# Patient Record
Sex: Female | Born: 1980 | Race: Asian | Hispanic: No | Marital: Married | Smoking: Never smoker
Health system: Southern US, Community
[De-identification: ages and names within clinical notes are randomized; demographics above are authoritative.]

## PROBLEM LIST (undated history)

## (undated) DIAGNOSIS — Z789 Other specified health status: Secondary | ICD-10-CM

---

## 2016-02-25 ENCOUNTER — Other Ambulatory Visit: Payer: Self-pay | Admitting: Family Medicine

## 2016-02-25 ENCOUNTER — Ambulatory Visit (INDEPENDENT_AMBULATORY_CARE_PROVIDER_SITE_OTHER): Payer: Managed Care, Other (non HMO) | Admitting: Family Medicine

## 2016-02-25 ENCOUNTER — Emergency Department (HOSPITAL_COMMUNITY): Payer: Managed Care, Other (non HMO)

## 2016-02-25 ENCOUNTER — Encounter (HOSPITAL_COMMUNITY): Payer: Self-pay | Admitting: Emergency Medicine

## 2016-02-25 ENCOUNTER — Ambulatory Visit (INDEPENDENT_AMBULATORY_CARE_PROVIDER_SITE_OTHER): Payer: Managed Care, Other (non HMO)

## 2016-02-25 ENCOUNTER — Inpatient Hospital Stay (HOSPITAL_COMMUNITY)
Admission: EM | Admit: 2016-02-25 | Discharge: 2016-02-26 | DRG: 390 | Disposition: A | Discharge status: Home / Self Care | Payer: Managed Care, Other (non HMO) | Attending: Surgery | Admitting: Surgery

## 2016-02-25 ENCOUNTER — Encounter (HOSPITAL_COMMUNITY): Admission: EM | Disposition: A | Discharge status: Home / Self Care | Payer: Self-pay | Source: Home / Self Care

## 2016-02-25 VITALS — BP 104/70 | HR 70 | Temp 97.6°F | Resp 18 | Ht 65.0 in | Wt 125.2 lb

## 2016-02-25 DIAGNOSIS — K5900 Constipation, unspecified: Secondary | ICD-10-CM | POA: Diagnosis not present

## 2016-02-25 DIAGNOSIS — K5909 Other constipation: Secondary | ICD-10-CM | POA: Diagnosis present

## 2016-02-25 DIAGNOSIS — K56609 Unspecified intestinal obstruction, unspecified as to partial versus complete obstruction: Secondary | ICD-10-CM

## 2016-02-25 DIAGNOSIS — D72829 Elevated white blood cell count, unspecified: Secondary | ICD-10-CM | POA: Diagnosis present

## 2016-02-25 DIAGNOSIS — K5660 Unspecified intestinal obstruction: Secondary | ICD-10-CM | POA: Diagnosis not present

## 2016-02-25 DIAGNOSIS — R1084 Generalized abdominal pain: Secondary | ICD-10-CM

## 2016-02-25 DIAGNOSIS — E876 Hypokalemia: Secondary | ICD-10-CM | POA: Diagnosis present

## 2016-02-25 DIAGNOSIS — K562 Volvulus: Secondary | ICD-10-CM | POA: Diagnosis present

## 2016-02-25 DIAGNOSIS — M545 Low back pain, unspecified: Secondary | ICD-10-CM

## 2016-02-25 HISTORY — PX: FLEXIBLE SIGMOIDOSCOPY: SHX5431

## 2016-02-25 HISTORY — DX: Other specified health status: Z78.9

## 2016-02-25 LAB — POCT CBC
Granulocyte percent: 76.4 %G (ref 37–80)
HCT, POC: 36 % — AB (ref 37.7–47.9)
Hemoglobin: 12.8 g/dL (ref 12.2–16.2)
Lymph, poc: 1.8 (ref 0.6–3.4)
MCH, POC: 29.3 pg (ref 27–31.2)
MCHC: 35.6 g/dL — AB (ref 31.8–35.4)
MCV: 82.4 fL (ref 80–97)
MID (CBC): 0.8 (ref 0–0.9)
MPV: 7.9 fL (ref 0–99.8)
PLATELET COUNT, POC: 277 10*3/uL (ref 142–424)
POC Granulocyte: 8.4 — AB (ref 2–6.9)
POC LYMPH %: 16.3 % (ref 10–50)
POC MID %: 7.3 %M (ref 0–12)
RBC: 4.37 M/uL (ref 4.04–5.48)
RDW, POC: 16.3 %
WBC: 11 10*3/uL — AB (ref 4.6–10.2)

## 2016-02-25 LAB — COMPREHENSIVE METABOLIC PANEL
ALBUMIN: 4.9 g/dL (ref 3.6–5.1)
ALBUMIN: 4.9 g/dL (ref 3.5–5.0)
ALK PHOS: 67 U/L (ref 33–115)
ALK PHOS: 62 U/L (ref 38–126)
ALT: 9 U/L (ref 6–29)
ALT: 13 U/L — ABNORMAL LOW (ref 14–54)
ANION GAP: 10 (ref 5–15)
AST: 14 U/L (ref 10–30)
AST: 17 U/L (ref 15–41)
BILIRUBIN TOTAL: 0.8 mg/dL (ref 0.2–1.2)
BILIRUBIN TOTAL: 0.8 mg/dL (ref 0.3–1.2)
BUN: 6 mg/dL — ABNORMAL LOW (ref 7–25)
BUN: 6 mg/dL (ref 6–20)
CALCIUM: 9.9 mg/dL (ref 8.6–10.2)
CALCIUM: 9.3 mg/dL (ref 8.9–10.3)
CO2: 29 mmol/L (ref 20–31)
CO2: 26 mmol/L (ref 22–32)
CREATININE: 0.62 mg/dL (ref 0.50–1.10)
Chloride: 98 mmol/L (ref 98–110)
Chloride: 99 mmol/L — ABNORMAL LOW (ref 101–111)
Creatinine, Ser: 0.62 mg/dL (ref 0.44–1.00)
GLUCOSE: 117 mg/dL — AB (ref 65–99)
GLUCOSE: 102 mg/dL — AB (ref 65–99)
POTASSIUM: 3.3 mmol/L — AB (ref 3.5–5.1)
Potassium: 3.7 mmol/L (ref 3.5–5.3)
Sodium: 137 mmol/L (ref 135–146)
Sodium: 135 mmol/L (ref 135–145)
TOTAL PROTEIN: 7.8 g/dL (ref 6.5–8.1)
Total Protein: 7.7 g/dL (ref 6.1–8.1)

## 2016-02-25 LAB — POCT URINALYSIS DIPSTICK
Glucose, UA: NEGATIVE
Ketones, UA: 15
Nitrite, UA: NEGATIVE
PH UA: 5.5
PROTEIN UA: 30
SPEC GRAV UA: 1.025
UROBILINOGEN UA: 0.2

## 2016-02-25 LAB — POCT URINE PREGNANCY: PREG TEST UR: NEGATIVE

## 2016-02-25 LAB — CBC WITH DIFFERENTIAL/PLATELET
BASOS ABS: 0 {cells}/uL (ref 0–200)
Basophils Relative: 0 %
EOS PCT: 0 %
Eosinophils Absolute: 0 cells/uL — ABNORMAL LOW (ref 15–500)
HCT: 37.6 % (ref 35.0–45.0)
Hemoglobin: 12.7 g/dL (ref 11.7–15.5)
LYMPHS PCT: 15 %
Lymphs Abs: 1620 cells/uL (ref 850–3900)
MCH: 28.3 pg (ref 27.0–33.0)
MCHC: 33.8 g/dL (ref 32.0–36.0)
MCV: 83.7 fL (ref 80.0–100.0)
MONOS PCT: 7 %
MPV: 10.9 fL (ref 7.5–12.5)
Monocytes Absolute: 756 cells/uL (ref 200–950)
NEUTROS ABS: 8424 {cells}/uL — AB (ref 1500–7800)
Neutrophils Relative %: 78 %
PLATELETS: 332 10*3/uL (ref 140–400)
RBC: 4.49 MIL/uL (ref 3.80–5.10)
RDW: 15.8 % — AB (ref 11.0–15.0)
WBC: 10.8 10*3/uL (ref 3.8–10.8)

## 2016-02-25 SURGERY — SIGMOIDOSCOPY, FLEXIBLE
Anesthesia: Moderate Sedation

## 2016-02-25 MED ORDER — DIPHENHYDRAMINE HCL 50 MG/ML IJ SOLN
INTRAMUSCULAR | Status: AC
  Filled 2016-02-25: qty 1

## 2016-02-25 MED ORDER — IOPAMIDOL (ISOVUE-300) INJECTION 61%: 100.0000 mL | Freq: Once | INTRAVENOUS | Status: DC | PRN

## 2016-02-25 MED ORDER — SODIUM CHLORIDE 0.9 % IV BOLUS (SEPSIS)
1000.0000 mL | Freq: Once | INTRAVENOUS | Status: AC
  Administered 2016-02-25: 1000 mL via INTRAVENOUS

## 2016-02-25 MED ORDER — ONDANSETRON HCL 4 MG/2ML IJ SOLN
4.0000 mg | Freq: Once | INTRAMUSCULAR | Status: AC
  Administered 2016-02-25: 4 mg via INTRAVENOUS
  Filled 2016-02-25: qty 2

## 2016-02-25 MED ORDER — POTASSIUM CHLORIDE 10 MEQ/100ML IV SOLN: 10.0000 meq | INTRAVENOUS | Status: AC

## 2016-02-25 MED ORDER — ONDANSETRON 4 MG PO TBDP
4.0000 mg | ORAL_TABLET | Freq: Four times a day (QID) | ORAL | Status: DC | PRN
Start: 2016-02-25 — End: 2016-02-26

## 2016-02-25 MED ORDER — KCL IN DEXTROSE-NACL 20-5-0.9 MEQ/L-%-% IV SOLN
INTRAVENOUS | Status: DC
  Administered 2016-02-25 – 2016-02-26 (×2): via INTRAVENOUS
  Filled 2016-02-25 (×4): qty 1000

## 2016-02-25 MED ORDER — ONDANSETRON HCL 4 MG/2ML IJ SOLN: 4.0000 mg | INTRAMUSCULAR | Status: DC | PRN

## 2016-02-25 MED ORDER — FENTANYL CITRATE (PF) 100 MCG/2ML IJ SOLN
INTRAMUSCULAR | Status: DC | PRN
  Administered 2016-02-25: 12.5 ug via INTRAVENOUS

## 2016-02-25 MED ORDER — FENTANYL CITRATE (PF) 100 MCG/2ML IJ SOLN
INTRAMUSCULAR | Status: AC
  Filled 2016-02-25: qty 2

## 2016-02-25 MED ORDER — FAMOTIDINE IN NACL 20-0.9 MG/50ML-% IV SOLN
20.0000 mg | Freq: Two times a day (BID) | INTRAVENOUS | Status: DC
  Administered 2016-02-25 – 2016-02-26 (×2): 20 mg via INTRAVENOUS
  Filled 2016-02-25 (×2): qty 50

## 2016-02-25 MED ORDER — MORPHINE SULFATE (PF) 4 MG/ML IV SOLN
4.0000 mg | Freq: Once | INTRAVENOUS | Status: AC
  Administered 2016-02-25: 4 mg via INTRAVENOUS
  Filled 2016-02-25: qty 1

## 2016-02-25 MED ORDER — MORPHINE SULFATE (PF) 4 MG/ML IV SOLN
4.0000 mg | INTRAVENOUS | Status: AC | PRN
  Administered 2016-02-25 (×3): 4 mg via INTRAVENOUS
  Filled 2016-02-25 (×3): qty 1

## 2016-02-25 MED ORDER — MIDAZOLAM HCL 10 MG/2ML IJ SOLN
INTRAMUSCULAR | Status: DC | PRN
  Administered 2016-02-25 (×2): 1 mg via INTRAVENOUS

## 2016-02-25 MED ORDER — ACETAMINOPHEN 10 MG/ML IV SOLN
1000.0000 mg | Freq: Four times a day (QID) | INTRAVENOUS | Status: DC | PRN
  Filled 2016-02-25: qty 100

## 2016-02-25 MED ORDER — DIATRIZOATE MEGLUMINE & SODIUM 66-10 % PO SOLN
15.0000 mL | Freq: Once | ORAL | Status: AC
  Administered 2016-02-25: 15 mL via ORAL

## 2016-02-25 MED ORDER — POTASSIUM CHLORIDE 10 MEQ/100ML IV SOLN
10.0000 meq | INTRAVENOUS | Status: AC
  Administered 2016-02-25 – 2016-02-26 (×2): 10 meq via INTRAVENOUS
  Filled 2016-02-25 (×2): qty 100

## 2016-02-25 MED ORDER — MIDAZOLAM HCL 5 MG/ML IJ SOLN
INTRAMUSCULAR | Status: AC
  Filled 2016-02-25: qty 2

## 2016-02-25 MED ORDER — SODIUM CHLORIDE 0.9 % IV SOLN
Freq: Once | INTRAVENOUS | Status: AC
  Administered 2016-02-25: 17:00:00 via INTRAVENOUS

## 2016-02-25 NOTE — ED Notes (Signed)
Pt being sent for large bowel obstruction vs ileus.  Pt c/o gen abd pain.  Has been vomiting.  Pain x 2-3 days.

## 2016-02-25 NOTE — ED Provider Notes (Signed)
Dr. Evette CristalGanem will eval the patient.  She will be admitted to hospitalist service.  Sylvia Munchobert Nilani Hugill, MD 02/25/16 [phone]

## 2016-02-25 NOTE — ED Notes (Signed)
Jeraldine LootsLockwood EDP gave verbal order to administer another morphine 4mg  IVP after 1723 dose.  Wants to hold NG tube insertion, [place] [facility] on his way to consult.

## 2016-02-25 NOTE — ED Notes (Signed)
Family at bedside. 

## 2016-02-25 NOTE — H&P (View-Only) (Signed)
Reason for Consult:Sigmoid Volvulus Referring Physician: Dr. Lockwood  Sylvia Barron is an 35 y.o. female.  HPI: She has a 4 day history of progressive abdominal bloating, pain and inability to have a BM.  She does have a history of chronic constipation and was straining to have a BM but was unable to pass any stool.  She has been getting progressing more distended since that time and has had some non-bloody n/v.  She states she is having pain in the left lower back.  No fever or chills.  Her husband is with her.  Past Medical History  Diagnosis Date  . Medical history non-contributory     Past Surgical History  Procedure Laterality Date  . Cesarean section      History reviewed. No pertinent family history.  Social History:  reports that she has never smoked. She has never used smokeless tobacco. She reports that she does not drink alcohol or use illicit drugs.  Allergies: No Known Allergies  Prior to Admission medications   Medication Sig Start Date End Date Taking? Authorizing Provider  polyethylene glycol (MIRALAX / GLYCOLAX) packet Take 17 g by mouth every 3 (three) hours as needed for mild constipation.   Yes Historical Provider, MD     Results for orders placed or performed during the hospital encounter of 02/25/16 (from the past 48 hour(s))  Comprehensive metabolic panel     Status: Abnormal   Collection Time: 02/25/16  1:59 PM  Result Value Ref Range   Sodium 135 135 - 145 mmol/L   Potassium 3.3 (L) 3.5 - 5.1 mmol/L   Chloride 99 (L) 101 - 111 mmol/L   CO2 26 22 - 32 mmol/L   Glucose, Bld 102 (H) 65 - 99 mg/dL   BUN 6 6 - 20 mg/dL   Creatinine, Ser 0.62 0.44 - 1.00 mg/dL   Calcium 9.3 8.9 - 10.3 mg/dL   Total Protein 7.8 6.5 - 8.1 g/dL   Albumin 4.9 3.5 - 5.0 g/dL   AST 17 15 - 41 U/L   ALT 13 (L) 14 - 54 U/L   Alkaline Phosphatase 62 38 - 126 U/L   Total Bilirubin 0.8 0.3 - 1.2 mg/dL   GFR calc non Af Amer >60 >60 mL/min   GFR calc Af Amer >60 >60 mL/min     Comment: (NOTE) The eGFR has been calculated using the CKD EPI equation. This calculation has not been validated in all clinical situations. eGFR's persistently <60 mL/min signify possible Chronic Kidney Disease.    Anion gap 10 5 - 15    Ct Abdomen Pelvis W Contrast  02/25/2016  CLINICAL DATA:  Abdominal pain with nausea for 2 days, no bowel movement for 4 days, no flatus, abdominal distension but soft, question bowel obstruction EXAM: CT ABDOMEN AND PELVIS WITH CONTRAST TECHNIQUE: Multidetector CT imaging of the abdomen and pelvis was performed using the standard protocol following bolus administration of intravenous contrast. Sagittal and coronal MPR images reconstructed from axial data set. CONTRAST:  Dilute oral contrast.  100 cc Isovue 300 IV. COMPARISON:  None. FINDINGS: Lung bases clear. Nonspecific low-attenuation lesion posterior RIGHT lobe liver 10 x 9 mm diameter image 28. Liver, gallbladder, spleen, pancreas, kidneys, and adrenal glands normal. Marked distention of the colon particularly a large redundant dilated sigmoid loop extending into the LEFT upper quadrant bulb of the LEFT diaphragm measuring up to 15.2 cm transverse. Twisting of the mesentery identified at the sigmoid mesocolon, most prominently identified on coronal imaging, compatible   with sigmoid volvulus. No colonic wall thickening or evidence of perforation. Small bowel decompressed. Stomach unremarkable. Normal appearing ureters, bladder, uterus, adnexae, and appendix. No mass, adenopathy, free fluid, free air, hernia, or acute bone lesion. IMPRESSION: Marked colonic distention up to 15.2 cm transverse at a markedly distended sigmoid loop which extends into the LEFT upper quadrant under LEFT hemi diaphragm associated with swirling of the sigmoid mesocolon. 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 No evidence of perforation or small bowel dilatation. Findings called to Dr. Oleta Mouse On 02/25/2016  at 1651 hours. Electronically Signed   By: Lavonia Dana M.D.   On: 02/25/2016 16:51   Dg Abd 2 Views  02/25/2016  CLINICAL DATA:  Vomiting and constipation. EXAM: ABDOMEN - 2 VIEW COMPARISON:  None. FINDINGS: There is marked abnormal distension of the large bowel loops. Colonic fluid levels are identified. There is stool identified within the distal colon and in the rectum. No free intraperitoneal air identified. IMPRESSION: 1. Marked distension of the large bowel loops which may reflect distal bowel obstruction versus colonic ileus. In the setting of the distal bowel obstruction this may reflect stool impaction versus mechanical obstruction secondary to mass or stricture. Electronically Signed   By: Kerby Moors M.D.   On: 02/25/2016 11:30   Dg Abd Acute W/chest  02/25/2016  CLINICAL DATA:  The patient is being sent for evaluation of bowel obstruction versus ileus. EXAM: DG ABDOMEN ACUTE W/ 1V CHEST COMPARISON:  None. FINDINGS: There is elevation left hemidiaphragm. The heart, hila, mediastinum, lungs, and pleura are normal. Dilated loops of large and small bowel are identified with air-fluid levels on the upright view. The top of the left hemidiaphragm was not included on the upright view. No free air seen under the right hemidiaphragm. No other evidence of free air identified. The bones and soft tissues are unremarkable. IMPRESSION: 1. Evaluation for free air is limited as the left hemidiaphragm was not completely included on the upright view. However, within this limitation, no evidence of free air is identified. 2. Dilated large and small bowel with air-fluid levels on the upright view. This could be seen with ileus or colonic obstruction. The findings are concerning for a colonic obstruction. Recommend CT imaging for further evaluation. Electronically Signed   By: Dorise Bullion III M.D   On: 02/25/2016 13:40    Review of Systems  Constitutional: Negative for fever and chills.  Respiratory: Negative  for shortness of breath.   Cardiovascular: Negative for chest pain.  Gastrointestinal: Positive for nausea, vomiting, abdominal pain and constipation.  Musculoskeletal: Positive for back pain.   Blood pressure 121/83, pulse 62, temperature 98.2 F (36.8 C), temperature source Oral, resp. rate 16, last menstrual period 02/12/2016, SpO2 98 %. Physical Exam  Constitutional:  Uncomfortable appearing female  HENT:  Head: Normocephalic and atraumatic.  Cardiovascular: Normal rate and regular rhythm.   Respiratory: Effort normal and breath sounds normal.  GI: There is no tenderness. There is no guarding.  Firm and distended with hypoactive bowel sounds.  Umbilical bulge present.  Musculoskeletal: She exhibits no edema.  Neurological: She is alert.    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 Hypokalemia.  Plan:  GI consult for sigmoidoscopy and detorsion/decompression (discussed with Dr. Paulita Fujita) followed by admission and sigmoid colectomy after bowel prep.  Correct hypokalemia.  Nili Honda J 02/25/2016, 6:03 PM

## 2016-02-25 NOTE — Progress Notes (Addendum)
[facility] ED CM noted pt with coverage but no pcp listed Confirms they have recently moved here and is does not have a pcp yet  [facility] ED CM spoke with pt on how to obtain an in network pcp with insurance coverage via the customer service number or web site  Cm reviewed ED level of care for crisis/emergent services and community pcp level of care to manage continuous or chronic medical concerns.  The pt voiced understanding CM encouraged pt and discussed pt's responsibility to verify with pt's insurance carrier that any recommended medical provider offered by any emergency room or a hospital provider is within the carrier's network. The pt voiced understanding  Provided a 10 page list of intenal medicine and gastroenterology  providers  Pt c/o abdominal pain and inquired about results of pt CT scan Cm referred pt to ED RN and EDP PA/NP  CM informed ED RN that pt inquired about pain medications

## 2016-02-25 NOTE — Consult Note (Signed)
Reason for Consult:Sigmoid Volvulus Referring Physician: Dr. Georges Lynch Sylvia Barron is an 35 y.o. female.  HPI: She has a 4 day history of progressive abdominal bloating, pain and inability to have a BM.  She does have a history of chronic constipation and was straining to have a BM but was unable to pass any stool.  She has been getting progressing more distended since that time and has had some non-bloody n/v.  She states she is having pain in the left lower back.  No fever or chills.  Her husband is with her.  Past Medical History  Diagnosis Date  . Medical history non-contributory     Past Surgical History  Procedure Laterality Date  . Cesarean section      History reviewed. No pertinent family history.  Social History:  reports that she has never smoked. She has never used smokeless tobacco. She reports that she does not drink alcohol or use illicit drugs.  Allergies: No Known Allergies  Prior to Admission medications   Medication Sig Start Date End Date Taking? Authorizing Provider  polyethylene glycol (MIRALAX / GLYCOLAX) packet Take 17 g by mouth every 3 (three) hours as needed for mild constipation.   Yes Historical Provider, MD     Results for orders placed or performed during the hospital encounter of 02/25/16 (from the past 48 hour(s))  Comprehensive metabolic panel     Status: Abnormal   Collection Time: 02/25/16  1:59 PM  Result Value Ref Range   Sodium 135 135 - 145 mmol/L   Potassium 3.3 (L) 3.5 - 5.1 mmol/L   Chloride 99 (L) 101 - 111 mmol/L   CO2 26 22 - 32 mmol/L   Glucose, Bld 102 (H) 65 - 99 mg/dL   BUN 6 6 - 20 mg/dL   Creatinine, Ser 0.62 0.44 - 1.00 mg/dL   Calcium 9.3 8.9 - 10.3 mg/dL   Total Protein 7.8 6.5 - 8.1 g/dL   Albumin 4.9 3.5 - 5.0 g/dL   AST 17 15 - 41 U/L   ALT 13 (L) 14 - 54 U/L   Alkaline Phosphatase 62 38 - 126 U/L   Total Bilirubin 0.8 0.3 - 1.2 mg/dL   GFR calc non Af Amer >60 >60 mL/min   GFR calc Af Amer >60 >60 mL/min     Comment: (NOTE) The eGFR has been calculated using the CKD EPI equation. This calculation has not been validated in all clinical situations. eGFR's persistently <60 mL/min signify possible Chronic Kidney Disease.    Anion gap 10 5 - 15    Ct Abdomen Pelvis W Contrast  02/25/2016  CLINICAL DATA:  Abdominal pain with nausea for 2 days, no bowel movement for 4 days, no flatus, abdominal distension but soft, question bowel obstruction EXAM: CT ABDOMEN AND PELVIS WITH CONTRAST TECHNIQUE: Multidetector CT imaging of the abdomen and pelvis was performed using the standard protocol following bolus administration of intravenous contrast. Sagittal and coronal MPR images reconstructed from axial data set. CONTRAST:  Dilute oral contrast.  100 cc Isovue 300 IV. COMPARISON:  None. FINDINGS: Lung bases clear. Nonspecific low-attenuation lesion posterior RIGHT lobe liver 10 x 9 mm diameter image 28. Liver, gallbladder, spleen, pancreas, kidneys, and adrenal glands normal. Marked distention of the colon particularly a large redundant dilated sigmoid loop extending into the LEFT upper quadrant bulb of the LEFT diaphragm measuring up to 15.2 cm transverse. Twisting of the mesentery identified at the sigmoid mesocolon, most prominently identified on coronal imaging, compatible  with sigmoid volvulus. No colonic wall thickening or evidence of perforation. Small bowel decompressed. Stomach unremarkable. Normal appearing ureters, bladder, uterus, adnexae, and appendix. No mass, adenopathy, free fluid, free air, hernia, or acute bone lesion. IMPRESSION: Marked colonic distention up to 15.2 cm transverse at a markedly distended sigmoid loop which extends into the LEFT upper quadrant under LEFT hemi diaphragm associated with swirling of the sigmoid mesocolon. 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 No evidence of perforation or small bowel dilatation. Findings called to Dr. Oleta Mouse On 02/25/2016  at 1651 hours. Electronically Signed   By: Lavonia Dana M.D.   On: 02/25/2016 16:51   Dg Abd 2 Views  02/25/2016  CLINICAL DATA:  Vomiting and constipation. EXAM: ABDOMEN - 2 VIEW COMPARISON:  None. FINDINGS: There is marked abnormal distension of the large bowel loops. Colonic fluid levels are identified. There is stool identified within the distal colon and in the rectum. No free intraperitoneal air identified. IMPRESSION: 1. Marked distension of the large bowel loops which may reflect distal bowel obstruction versus colonic ileus. In the setting of the distal bowel obstruction this may reflect stool impaction versus mechanical obstruction secondary to mass or stricture. Electronically Signed   By: Kerby Moors M.D.   On: 02/25/2016 11:30   Dg Abd Acute W/chest  02/25/2016  CLINICAL DATA:  The patient is being sent for evaluation of bowel obstruction versus ileus. EXAM: DG ABDOMEN ACUTE W/ 1V CHEST COMPARISON:  None. FINDINGS: There is elevation left hemidiaphragm. The heart, hila, mediastinum, lungs, and pleura are normal. Dilated loops of large and small bowel are identified with air-fluid levels on the upright view. The top of the left hemidiaphragm was not included on the upright view. No free air seen under the right hemidiaphragm. No other evidence of free air identified. The bones and soft tissues are unremarkable. IMPRESSION: 1. Evaluation for free air is limited as the left hemidiaphragm was not completely included on the upright view. However, within this limitation, no evidence of free air is identified. 2. Dilated large and small bowel with air-fluid levels on the upright view. This could be seen with ileus or colonic obstruction. The findings are concerning for a colonic obstruction. Recommend CT imaging for further evaluation. Electronically Signed   By: Dorise Bullion III M.D   On: 02/25/2016 13:40    Review of Systems  Constitutional: Negative for fever and chills.  Respiratory: Negative  for shortness of breath.   Cardiovascular: Negative for chest pain.  Gastrointestinal: Positive for nausea, vomiting, abdominal pain and constipation.  Musculoskeletal: Positive for back pain.   Blood pressure 121/83, pulse 62, temperature 98.2 F (36.8 C), temperature source Oral, resp. rate 16, last menstrual period 02/12/2016, SpO2 98 %. Physical Exam  Constitutional:  Uncomfortable appearing female  HENT:  Head: Normocephalic and atraumatic.  Cardiovascular: Normal rate and regular rhythm.   Respiratory: Effort normal and breath sounds normal.  GI: There is no tenderness. There is no guarding.  Firm and distended with hypoactive bowel sounds.  Umbilical bulge present.  Musculoskeletal: She exhibits no edema.  Neurological: She is alert.    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 Hypokalemia.  Plan:  GI consult for sigmoidoscopy and detorsion/decompression (discussed with Dr. Paulita Fujita) followed by admission and sigmoid colectomy after bowel prep.  Correct hypokalemia.  Sweet Jarvis J 02/25/2016, 6:03 PM

## 2016-02-25 NOTE — ED Provider Notes (Signed)
CSN: [id number]     Arrival date & time 02/25/16  1159 History   First MD Initiated Contact with Patient 02/25/16 1255     Chief Complaint  Patient presents with  . Bowel Obstruction vs Ileus      (Consider location/radiation/quality/duration/timing/severity/associated sxs/prior Treatment) HPI 35 year old female who presents with abdominal pain nausea and vomiting. She has a history of cesarean section. States that 4 days ago she began to have intermittent abdominal pain, abdominal bloating and constipation. States that she strained very hard and has a sensation of an urge to have a bowel movement but is unable to get anything out. This had 3 episodes of nonbilious non-bloody emesis today. This has not happened to her before in the past. She has not had fevers or chills. She denies dysuria, urinary frequency vaginal bleeding or discharge. Was seen in the internal medicine clinic earlier today and had x-ray concerning for bowel obstruction and sent to the ED. Past Medical History  Diagnosis Date  . Medical history non-contributory    Past Surgical History  Procedure Laterality Date  . Cesarean section     History reviewed. No pertinent family history. Social History  Substance Use Topics  . Smoking status: Never Smoker   . Smokeless tobacco: Never Used  . Alcohol Use: No   OB History    No data available     Review of Systems 10/14 systems reviewed and are negative other than those stated in the HPI   Allergies  Review of patient's allergies indicates no known allergies.  Home Medications   Prior to Admission medications   Medication Sig Start Date End Date Taking? Authorizing Provider  polyethylene glycol (MIRALAX / GLYCOLAX) packet Take 17 g by mouth every 3 (three) hours as needed for mild constipation.   Yes Historical Provider, MD   BP 95/60 mmHg  Pulse 58  Temp(Src) 98.4 F (36.9 C) (Oral)  Resp 16  Ht 5\' 5"  (1.651 m)  Wt 147 lb 0.8 oz (66.7 kg)  BMI 24.47 kg/m2   SpO2 100%  LMP 02/12/2016 Physical Exam Physical Exam  Nursing note and vitals reviewed. Constitutional: Well developed, well nourished, non-toxic, and in no acute distress Head: Normocephalic and atraumatic.  Mouth/Throat: Oropharynx is clear and moist.  Neck: Normal range of motion. Neck supple.  Cardiovascular: Normal rate and regular rhythm.   Pulmonary/Chest: Effort normal and breath sounds normal.  Abdominal: Soft. Moderate distension There is minimal diffuse tenderness. There is no rebound and no guarding. no fecal impaction Musculoskeletal: Normal range of motion.  Neurological: Alert, no facial droop, fluent speech, moves all extremities symmetrically Skin: Skin is warm and dry.  Psychiatric: Cooperative  ED Course  Procedures (including critical care time) Labs Review Labs Reviewed  COMPREHENSIVE METABOLIC PANEL - Abnormal; Notable for the following:    Potassium 3.3 (*)    Chloride 99 (*)    Glucose, Bld 102 (*)    ALT 13 (*)    All other components within normal limits  BASIC METABOLIC PANEL - Abnormal; Notable for the following:    Glucose, Bld 124 (*)    Calcium 8.5 (*)    All other components within normal limits  CBC - Abnormal; Notable for the following:    WBC 11.4 (*)    Hemoglobin 11.6 (*)    HCT 35.3 (*)    RDW 15.6 (*)    All other components within normal limits    Imaging Review Ct Abdomen Pelvis W Contrast  02/25/2016  CLINICAL DATA:  Abdominal pain with nausea for 2 days, no bowel movement for 4 days, no flatus, abdominal distension but soft, question bowel obstruction EXAM: CT ABDOMEN AND PELVIS WITH CONTRAST TECHNIQUE: Multidetector CT imaging of the abdomen and pelvis was performed using the standard protocol following bolus administration of intravenous contrast. Sagittal and coronal MPR images reconstructed from axial data set. CONTRAST:  Dilute oral contrast.  100 cc Isovue 300 IV. COMPARISON:  None. FINDINGS: Lung bases clear. Nonspecific  low-attenuation lesion posterior RIGHT lobe liver 10 x 9 mm diameter image 28. Liver, gallbladder, spleen, pancreas, kidneys, and adrenal glands normal. Marked distention of the colon particularly a large redundant dilated sigmoid loop extending into the LEFT upper quadrant bulb of the LEFT diaphragm measuring up to 15.2 cm transverse. Twisting of the mesentery identified at the sigmoid mesocolon, most prominently identified on coronal imaging, compatible with sigmoid volvulus. No colonic wall thickening or evidence of perforation. Small bowel decompressed. Stomach unremarkable. Normal appearing ureters, bladder, uterus, adnexae, and appendix. No mass, adenopathy, free fluid, free air, hernia, or acute bone lesion. IMPRESSION: Marked colonic distention up to 15.2 cm transverse at a markedly distended sigmoid loop which extends into the LEFT upper quadrant under LEFT hemi diaphragm associated with swirling of the sigmoid mesocolon. 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 No evidence of perforation or small bowel dilatation. Findings called to Dr. Verdie Mosher On 02/25/2016 at 1651 hours. Electronically Signed   By: Ulyses Southward M.D.   On: 02/25/2016 16:51   Dg Abd 2 Views  02/25/2016  CLINICAL DATA:  Vomiting and constipation. EXAM: ABDOMEN - 2 VIEW COMPARISON:  None. FINDINGS: There is marked abnormal distension of the large bowel loops. Colonic fluid levels are identified. There is stool identified within the distal colon and in the rectum. No free intraperitoneal air identified. IMPRESSION: 1. Marked distension of the large bowel loops which may reflect distal bowel obstruction versus colonic ileus. In the setting of the distal bowel obstruction this may reflect stool impaction versus mechanical obstruction secondary to mass or stricture. Electronically Signed   By: Signa Kell M.D.   On: 02/25/2016 11:30   Dg Abd Acute W/chest  02/25/2016  CLINICAL DATA:  The patient is  being sent for evaluation of bowel obstruction versus ileus. EXAM: DG ABDOMEN ACUTE W/ 1V CHEST COMPARISON:  None. FINDINGS: There is elevation left hemidiaphragm. The heart, hila, mediastinum, lungs, and pleura are normal. Dilated loops of large and small bowel are identified with air-fluid levels on the upright view. The top of the left hemidiaphragm was not included on the upright view. No free air seen under the right hemidiaphragm. No other evidence of free air identified. The bones and soft tissues are unremarkable. IMPRESSION: 1. Evaluation for free air is limited as the left hemidiaphragm was not completely included on the upright view. However, within this limitation, no evidence of free air is identified. 2. Dilated large and small bowel with air-fluid levels on the upright view. This could be seen with ileus or colonic obstruction. The findings are concerning for a colonic obstruction. Recommend CT imaging for further evaluation. Electronically Signed   By: Gerome Sam III M.D   On: 02/25/2016 13:40   I have personally reviewed and evaluated these images and lab results as part of my medical decision-making.   EKG Interpretation None      MDM   Final diagnoses:  Sigmoid volvulus ([facility])    35 year old female with history of cesarean  section who presents with nausea vomiting abdominal pain and constipation. X-ray from internal medicine clinic visualized she has dilated loops of large and small bowel with air-fluid levels concerning for obstruction. She did have initial blood work and urine performed at clinic. UA is unremarkable and she is not pregnant. Blood counts without significant leukocytosis or anemia. We'll obtain chemistry. She will undergo CT ABd/pelvis.    CT visualized and reviewed with radiology. There is sigmoid volvulus. GI consulted for flex sigmoidoscopy for detorsion and plan for admission.    Lavera Guise, MD 02/26/16 [phone]

## 2016-02-25 NOTE — Op Note (Signed)
[facility] Patient Name: Sylvia Barron Procedure Date: 02/25/2016 MRN: [id number] Attending MD: Willis Modena , MD Date of Birth: [birth year] CSN: [id number] Age: 35 Admit Type: Outpatient Procedure:                Flexible Sigmoidoscopy Indications:              Abnormal CT of the GI tract, abdominal distention,                            Suspected volvulus, For therapy of volvulus,                            Generalized abdominal pain Providers:                Willis Modena, MD, Tomma Rakers, RN, Kandice Robinsons, Technician Referring MD:             Avel Peace, MD Medicines:                Fentanyl 12.5 micrograms IV, Midazolam 2 mg IV Complications:            No immediate complications. Estimated Blood Loss:     Estimated blood loss was minimal. Procedure:                Pre-Anesthesia Assessment:                           - Prior to the procedure, a History and Physical                            was performed, and patient medications and                            allergies were reviewed. The patient's tolerance of                            previous anesthesia was also reviewed. The risks                            and benefits of the procedure and the sedation                            options and risks were discussed with the patient.                            All questions were answered, and informed consent                            was obtained. Prior Anticoagulants: The patient has                            taken no previous anticoagulant or antiplatelet  agents. ASA Grade Assessment: I - A normal, healthy                            patient. After reviewing the risks and benefits,                            the patient was deemed in satisfactory condition to                            undergo the procedure.                           After obtaining informed consent, the scope was                   passed under direct vision. The [id number] ([id number])                            scope was introduced through the anus and advanced                            to the the descending colon. The flexible                            sigmoidoscopy was accomplished without difficulty.                            The patient tolerated the procedure well. The                            quality of the bowel preparation was unsatisfactory. Scope In: Scope Out: Findings:      The perianal and digital rectal examinations were normal.      A suspected volvulus, with edematous but otherwise viable-appearing       mucosa, was found in the distal sigmoid colon. Extensive colonic       distention upstream from the "twist" of the volvulus was noted, with       lots of air, as well as solid and liquid stool. Decompression of the       volvulus was attempted endoscopically, and partial decompression was       achieved. One liter of liquid stool suctioned, as well as large amounts       of air. Retroflexion into rectum was not performed. Abdominal exam       post-procedure showed much less abdominal distention. Estimated blood       loss was minimal. Impression:               - Preparation of the colon was unsatisfactory for                            any type of detailed exam, though mucosa at region                            of the volvulus, though edematous, appeared viable.                           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 Partial decompression                            achieved. Abdominal distention much improved after                            this procedure. Moderate Sedation:      Moderate (conscious) sedation was administered by the endoscopy nurse       and supervised by the endoscopist. The following parameters were       monitored: oxygen saturation, heart rate, blood pressure, and response       to care. Recommendation:           - Return patient to hospital ward for  ongoing care.                           - NPO today.                           - The findings and recommendations were discussed                            with the surgeon.                           - Both surgical team and I feel sigmoid resection,                            during this admission and after effective bowel                            prep, is the preferred route of management.                           Deboraha Sprang- [facility] will follow at a distance; please call                            with questions; thank you for the consultation. Procedure Code(s):        --- Professional ---                           [phone], Sigmoidoscopy, flexible; with decompression                            (for pathologic distention) (eg, volvulus,                            megacolon), including placement of decompression                            tube, when performed Diagnosis Code(s):        --- Professional ---                           K56.2, Volvulus  R10.84, Generalized abdominal pain                           R93.3, Abnormal findings on diagnostic imaging of                            other parts of digestive tract CPT copyright 2016 American [employer] Association. All rights reserved. The codes documented in this report are preliminary and upon coder review may  be revised to meet current compliance requirements. Willis Modena, MD 02/25/2016 8:29:28 PM This report has been signed electronically. Number of Addenda: 0

## 2016-02-25 NOTE — ED Notes (Signed)
Pt sent by Dr. Guinevere ScarletBlake Williams from clinic for colonic obstruction. Vital signs currently stable.

## 2016-02-25 NOTE — ED Notes (Addendum)
Pt reports abd pain with nausea x 2 days, LBM x 4 days ago.  Pt also reports nausea but denies any vomiting at this time.  Negative flatus.  Pt's abd appears distended but soft.  Pt drinking PO contrast

## 2016-02-25 NOTE — ED Notes (Signed)
Patient transported to X-ray 

## 2016-02-25 NOTE — Consult Note (Signed)
[facility] Consultation Note  Referring Provider: Dr. Avel Peace ([facility]) Primary Care Physician:  No primary care provider on file.  Reason for Consultation:  Sigmoid volvulus  HPI: Sylvia Barron is a 35 y.o. female whom we've been asked to see for sigmoid volvulus.  She has chronic constipation.  No bowel movement for the past several days, accompanied by progressively increasing abdominal distention and nausea and vomiting.  Has back pain and some milder abdominal pain.  No blood in stool.  Imaging studies have shown findings worrisome for sigmoid volvulus.   Past Medical History  Diagnosis Date  . Medical history non-contributory     Past Surgical History  Procedure Laterality Date  . Cesarean section      Prior to Admission medications   Medication Sig Start Date End Date Taking? Authorizing Provider  polyethylene glycol (MIRALAX / GLYCOLAX) packet Take 17 g by mouth every 3 (three) hours as needed for mild constipation.   Yes Historical Provider, MD    Current Facility-Administered Medications  Medication Dose Route Frequency Provider Last Rate Last Dose  . [MAR Hold] acetaminophen (OFIRMEV) IV 1,000 mg  1,000 mg Intravenous Q6H PRN Avel Peace, MD      . dextrose 5 % and 0.9 % NaCl with KCl 20 mEq/L infusion   Intravenous Continuous Avel Peace, MD      . Mitzi Hansen Hold] famotidine (PEPCID) IVPB 20 mg premix  20 mg Intravenous Q12H Avel Peace, MD      . Mitzi Hansen Hold] ondansetron (ZOFRAN-ODT) disintegrating tablet 4 mg  4 mg Oral Q6H PRN Avel Peace, MD       Or  . Mitzi Hansen Hold] ondansetron [facility]) injection 4 mg  4 mg Intravenous Q4H PRN Avel Peace, MD      . Mitzi Hansen Hold] potassium chloride 10 mEq in 100 mL IVPB  10 mEq Intravenous Q1 Hr x 2 Avel Peace, MD        Allergies as of 02/25/2016  . (No Known Allergies)    History reviewed. No pertinent family history.  Social History   Social History  . Marital Status: Married    Spouse  Name: N/A  . Number of Children: N/A  . Years of Education: N/A   Occupational History  . Not on file.   Social History Main Topics  . Smoking status: Never Smoker   . Smokeless tobacco: Never Used  . Alcohol Use: No  . Drug Use: No  . Sexual Activity: Not on file   Other Topics Concern  . Not on file   Social History Narrative    Review of Systems: Unable to obtain due to patient's somnolent state  Physical Exam: Vital signs in last 24 hours: Temp:  [97.6 F (36.4 C)-98.2 F (36.8 C)] 98.2 F (36.8 C) (06/05 1730) Pulse Rate:  [55-78] 62 (06/05 1730) Resp:  [16-18] 16 (06/05 1730) BP: (90-121)/(70-83) 121/83 mmHg (06/05 1730) SpO2:  [96 %-100 %] 98 % (06/05 1730) Weight:  [56.79 kg (125 lb 3.2 oz)] 56.79 kg (125 lb 3.2 oz) (06/05 0904)   General:   Alert,  Somnolent but arousable, NAD Head:  Normocephalic and atraumatic. Eyes:  Sclera clear, no icterus.   Conjunctiva pink. Ears:  Normal auditory acuity. Nose:  No deformity, discharge,  or lesions. Mouth:  No deformity or lesions.  Oropharynx pink but dry Neck:  Supple; no masses or thyromegaly. Lungs:  Clear throughout to auscultation.   No wheezes, crackles, or rhonchi. No acute distress. Heart:  Regular rate  and rhythm; no murmurs, clicks, rubs,  or gallops. Abdomen:  Markedly distended and tympanic; mild diffuse tenderness. No masses, hepatosplenomegaly; small umbilical hernia; scant trickling bowel sounds, without guarding, and without rebound.     Msk:  Symmetrical without gross deformities. Normal posture. Pulses:  Normal pulses noted. Extremities:  Without clubbing or edema. Neurologic:  Alert and  oriented x4; diffusely weak, otherwise  grossly normal neurologically. Skin:  Intact without significant lesions or rashes. Psych:  Alert and cooperative. Normal mood and affect.   Lab Results:  Recent Labs  02/25/16 1043  WBC 11.0*  HGB 12.8  HCT 36.0*   BMET  Recent Labs  02/25/16 1359  NA 135  K  3.3*  CL 99*  CO2 26  GLUCOSE 102*  BUN 6  CREATININE 0.62  CALCIUM 9.3   LFT  Recent Labs  02/25/16 1359  PROT 7.8  ALBUMIN 4.9  AST 17  ALT 13*  ALKPHOS 62  BILITOT 0.8   PT/INR No results for input(s): LABPROT, INR in the last 72 hours.  Studies/Results: Ct Abdomen Pelvis W Contrast  02/25/2016  CLINICAL DATA:  Abdominal pain with nausea for 2 days, no bowel movement for 4 days, no flatus, abdominal distension but soft, question bowel obstruction EXAM: CT ABDOMEN AND PELVIS WITH CONTRAST TECHNIQUE: Multidetector CT imaging of the abdomen and pelvis was performed using the standard protocol following bolus administration of intravenous contrast. Sagittal and coronal MPR images reconstructed from axial data set. CONTRAST:  Dilute oral contrast.  100 cc Isovue 300 IV. COMPARISON:  None. FINDINGS: Lung bases clear. Nonspecific low-attenuation lesion posterior RIGHT lobe liver 10 x 9 mm diameter image 28. Liver, gallbladder, spleen, pancreas, kidneys, and adrenal glands normal. Marked distention of the colon particularly a large redundant dilated sigmoid loop extending into the LEFT upper quadrant bulb of the LEFT diaphragm measuring up to 15.2 cm transverse. Twisting of the mesentery identified at the sigmoid mesocolon, most prominently identified on coronal imaging, compatible with sigmoid volvulus. No colonic wall thickening or evidence of perforation. Small bowel decompressed. Stomach unremarkable. Normal appearing ureters, bladder, uterus, adnexae, and appendix. No mass, adenopathy, free fluid, free air, hernia, or acute bone lesion. IMPRESSION: Marked colonic distention up to 15.2 cm transverse at a markedly distended sigmoid loop which extends into the LEFT upper quadrant under LEFT hemi diaphragm associated with swirling of the sigmoid mesocolon. 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 No evidence of perforation or small bowel dilatation.  Findings called to Dr. Verdie Mosher On 02/25/2016 at 1651 hours. Electronically Signed   By: Ulyses Southward M.D.   On: 02/25/2016 16:51   Dg Abd 2 Views  02/25/2016  CLINICAL DATA:  Vomiting and constipation. EXAM: ABDOMEN - 2 VIEW COMPARISON:  None. FINDINGS: There is marked abnormal distension of the large bowel loops. Colonic fluid levels are identified. There is stool identified within the distal colon and in the rectum. No free intraperitoneal air identified. IMPRESSION: 1. Marked distension of the large bowel loops which may reflect distal bowel obstruction versus colonic ileus. In the setting of the distal bowel obstruction this may reflect stool impaction versus mechanical obstruction secondary to mass or stricture. Electronically Signed   By: Signa Kell M.D.   On: 02/25/2016 11:30   Dg Abd Acute W/chest  02/25/2016  CLINICAL DATA:  The patient is being sent for evaluation of bowel obstruction versus ileus. EXAM: DG ABDOMEN ACUTE W/ 1V CHEST COMPARISON:  None. FINDINGS: There is elevation left  hemidiaphragm. The heart, hila, mediastinum, lungs, and pleura are normal. Dilated loops of large and small bowel are identified with air-fluid levels on the upright view. The top of the left hemidiaphragm was not included on the upright view. No free air seen under the right hemidiaphragm. No other evidence of free air identified. The bones and soft tissues are unremarkable. IMPRESSION: 1. Evaluation for free air is limited as the left hemidiaphragm was not completely included on the upright view. However, within this limitation, no evidence of free air is identified. 2. Dilated large and small bowel with air-fluid levels on the upright view. This could be seen with ileus or colonic obstruction. The findings are concerning for a colonic obstruction. Recommend CT imaging for further evaluation. Electronically Signed   By: Gerome Samavid  Williams III M.D   On: 02/25/2016 13:40    Impression:  1.  Chronic constipation. 2.   Abdominal distention.  Sigmoid volvulus suggested on imaging.  Plan:  1.  Flexible sigmoidoscopy for attempted colonic detorsion and decompression +/- rectal tube placement. 2.  Risks (bleeding, infection, bowel perforation that could require surgery, sedation-related changes in cardiopulmonary systems), benefits (identification and possible treatment of source of symptoms, exclusion of certain causes of symptoms), and alternatives (watchful waiting, radiographic imaging studies, empiric medical treatment) of decompressive colonoscopy were explained to patient/family in detail and patient/family wishes to proceed.  Largest risk is for perforation, noting that colonic diameter is already at 15cm.  Husband signed consent given patient's somnolent state after receiving Fentanyl. 3.  Patient has some abdominal tenderness but has no peritonitis; hard to gauge her exact amount of pain given multiple doses of Fentanyl she's received.  I have discussed case at length with Dr. Abbey Chattersosenbower, and we both agree that attempted decompressive sigmoidoscopy is the next best step in management.   LOS: 0 days   Chrisopher Pustejovsky M  02/25/2016, 7:41 PM  Pager [phone] If no answer or after 5 PM call [phone]

## 2016-02-25 NOTE — Interval H&P Note (Signed)
History and Physical Interval Note:  02/25/2016 7:50 PM  Sylvia Barron  has presented today for surgery, with the diagnosis of decompression  The various methods of treatment have been discussed with the patient and family. After consideration of risks, benefits and other options for treatment, the patient has consented to  Procedure(s): FLEXIBLE SIGMOIDOSCOPY (N/A) as a surgical intervention .  The patient's history has been reviewed, patient examined, no change in status, stable for surgery.  I have reviewed the patient's chart and labs.  Questions were answered to the patient's satisfaction.     Freddy JakschUTLAW,Lanyia Jewel M

## 2016-02-25 NOTE — ED Notes (Addendum)
Went in to medicate pt again, pt states she just vomited and pain went away.  Declined pain med at this time.  Instructed pt to sip on PO contrast as much as she can.

## 2016-02-25 NOTE — Progress Notes (Signed)
[facility] [address], [address] [place], [place] [address] [phone] [phone]  Date:  02/25/2016   Name:  Sylvia RedoSaranya Statz   DOB:  06/15/1981   MRN:  [id number]  PCP:  No primary care provider on file.    Chief Complaint: Constipation; Abdominal Pain; Emesis; and Diarrhea   History of Present Illness:  Sylvia RedoSaranya Mcclanahan is a 35 y.o. very pleasant female patient who presents with the following: - 4-5 days intermittent constipation -- Chronic, exacerbated -- Takes miralax during exacerbation, but this has not helped - 2-3 days irregular moderate intensity b/l back/abdominal pain recently with clear liquid diarrhea. No melena or bloody stool.   - Emesis x 3, last time last night. Non-bilious, non-bloody. Unable to keep much down.  - Unsure of pregnancy status.  - No other sick contacts.  - No systemic symptoms. No urinary symptoms.  - No pain medications.  - No impaction or SBO history.  - History of C-section, no other surgery.    There are no active problems to display for this patient.   No past medical history on file.  No past surgical history on file.  Social History  Substance Use Topics  . Smoking status: Never Smoker   . Smokeless tobacco: None  . Alcohol Use: None    No family history on file.  No Known Allergies  Medication list has been reviewed and updated.  No current outpatient prescriptions on file prior to visit.   No current facility-administered medications on file prior to visit.    Review of Systems:  Review of Systems  Constitutional: Negative for fever, chills, weight loss and malaise/fatigue.  Eyes: Negative for blurred vision and double vision.  Respiratory: Negative for cough and shortness of breath.   Cardiovascular: Negative for chest pain.  Gastrointestinal: Positive for nausea, vomiting, abdominal pain and constipation. Negative for heartburn, diarrhea, blood in stool and melena.   Genitourinary: Negative for dysuria, urgency, frequency, hematuria and flank pain.  Skin: Negative for rash.  Neurological: Negative for headaches.    Physical Examination: Filed Vitals:   02/25/16 0904  BP: 104/70  Pulse: 70  Temp: 97.6 F (36.4 C)  Resp: 18   Filed Vitals:   02/25/16 0904  Height: 5\' 5"  (1.651 m)  Weight: 125 lb 3.2 oz (56.79 kg)   Body mass index is 20.83 kg/(m^2). Ideal Body Weight: Weight in (lb) to have BMI = 25: 149.9  GEN: WDWN, NAD, Non-toxic, A & O x 3 HEENT: Atraumatic, Normocephalic. Neck supple. No masses, No LAD. Ears and Nose: No external deformity. CV: RRR, No M/G/R. No JVD. No thrill. No extra heart sounds. PULM: CTA B, no wheezes, crackles, rhonchi. No retractions. No resp. distress. No accessory muscle use. ABD: Soft, but distend.  Increased bowel sounds, diminished in LLQ. Mildly TTP in epigastric area and RLQ.  No rebound. No HSM. EXTR: No c/c/e NEURO Normal gait.  PSYCH: Normally interactive. Conversant. Not depressed or anxious appearing.  Calm demeanor.   XR finding: colonic obstruction.   Assessment and Plan: Obstruction: CBC borderline elevated.  Vitals snl. UPT negative. Colonic obstruction on XR. Hx of c-section.  [facility]. Counseled on proceeding directly to the ED without stopping. No PO intake.  Discussed serious nature of  Issue of possible consequences.   Signed Guinevere ScarletBlake Gladiola Madore, MD

## 2016-02-25 NOTE — ED Notes (Signed)
Pt reports pain is now radiating to her back.  [facility] EDP notified.

## 2016-02-25 NOTE — Patient Instructions (Addendum)
     IF you received an x-ray today, you will receive an invoice from [facility]. Please contact [facility] at [phone] with questions or concerns regarding your invoice.   IF you received labwork today, you will receive an invoice from [facility]. Please contact [facility] at [phone] with questions or concerns regarding your invoice.   Our billing staff will not be able to assist you with questions regarding bills from these companies.  You will be contacted with the lab results as soon as they are available. The fastest way to get your results is to activate your My Chart account. Instructions are located on the last page of this paperwork. If you have not heard from us regarding the results in 2 weeks, please contact this office.    Proceed immediately to [facility]. We have provided you with the address and map.  Go immediately there.

## 2016-02-26 ENCOUNTER — Encounter (HOSPITAL_COMMUNITY): Payer: Self-pay | Admitting: Gastroenterology

## 2016-02-26 ENCOUNTER — Inpatient Hospital Stay (HOSPITAL_COMMUNITY): Payer: Managed Care, Other (non HMO)

## 2016-02-26 LAB — BASIC METABOLIC PANEL
Anion gap: 5 (ref 5–15)
BUN: 6 mg/dL (ref 6–20)
CALCIUM: 8.5 mg/dL — AB (ref 8.9–10.3)
CO2: 27 mmol/L (ref 22–32)
CREATININE: 0.53 mg/dL (ref 0.44–1.00)
Chloride: 105 mmol/L (ref 101–111)
GFR calc Af Amer: >60 mL/min (ref >60)
Glucose, Bld: 124 mg/dL — ABNORMAL HIGH (ref 65–99)
Potassium: 4.2 mmol/L (ref 3.5–5.1)
SODIUM: 137 mmol/L (ref 135–145)

## 2016-02-26 LAB — CBC
HCT: 35.3 % — ABNORMAL LOW (ref 36.0–46.0)
Hemoglobin: 11.6 g/dL — ABNORMAL LOW (ref 12.0–15.0)
MCH: 28.4 pg (ref 26.0–34.0)
MCHC: 32.9 g/dL (ref 30.0–36.0)
MCV: 86.5 fL (ref 78.0–100.0)
PLATELETS: 268 10*3/uL (ref 150–400)
RBC: 4.08 MIL/uL (ref 3.87–5.11)
RDW: 15.6 % — AB (ref 11.5–15.5)
WBC: 11.4 10*3/uL — AB (ref 4.0–10.5)

## 2016-02-26 MED ORDER — POLYETHYLENE GLYCOL 3350 17 G PO PACK
17.0000 g | PACK | Freq: Three times a day (TID) | ORAL | Status: DC
  Administered 2016-02-26: 17 g via ORAL
  Filled 2016-02-26: qty 1

## 2016-02-26 MED ORDER — POLYETHYLENE GLYCOL 3350 17 G PO PACK: 17.0000 g | PACK | Freq: Every day | ORAL | Status: AC

## 2016-02-26 MED ORDER — LIP MEDEX EX OINT
TOPICAL_OINTMENT | CUTANEOUS | Status: DC | PRN
  Filled 2016-02-26: qty 7

## 2016-02-26 MED ORDER — DOCUSATE SODIUM 100 MG PO CAPS: 100.0000 mg | ORAL_CAPSULE | Freq: Two times a day (BID) | ORAL | Status: AC

## 2016-02-26 NOTE — Progress Notes (Signed)
Nursing Discharge Summary  Patient ID: Sylvia Barron MRN: [id number] DOB/AGE: 33/05/1981 33 y.o.  Admit date: 02/25/2016 Discharge date: 02/26/2016  Discharged Condition: good  Disposition: Home  Follow-up Information    Follow up with You may obtain an in network pcp with insurance coverage via the customer service number or web site - [email or web address].   Contact information:   Please also use the list of in network providers provided to you by the ED case manager       Schedule an appointment as soon as possible for a visit with Adolph PollackOSENBOWER,TODD J, MD.   Specialty:  General Surgery   Why:  For post-hospital follow up, As needed   Contact information:   [address] [name] [phone]       Please follow up.   Why:  We urge you to make arrangements to see a surgeon in [place] as soon as you arrive home.  You may need to be considered for a sigmoid colectomy since you sigmoid volvulus could return.      Prescriptions Given: No prescriptions needed -- Colace and Miralax over the counter.   Means of Discharge: Patient to be taken home with husband -- to be taken down via wheelchair.    Signed: Gloriajean DellBaldwin, Kennieth Plotts Danielle 02/26/2016, 4:40 PM

## 2016-02-26 NOTE — Progress Notes (Signed)
[facility] Progress Note  1 Day Post-Op  Subjective: Pt's pain much improved.  No N/V, distension and pain improved.  She's had 2 BM's already and passed gas.  She tells me she has a twin sister who also had a volvulus surgery recently in [place].  She's concerned about having surgery and the recovery needed because she needs to return to [place] on June 20th.    Objective: Vital signs in last 24 hours: Temp:  [97.6 F (36.4 C)-98.4 F (36.9 C)] 98.4 F (36.9 C) (06/06 0539) Pulse Rate:  [51-78] 58 (06/06 0539) Resp:  [13-22] 16 (06/06 0539) BP: (83-121)/(39-94) 95/60 mmHg (06/06 0539) SpO2:  [96 %-100 %] 100 % (06/06 0539) Weight:  [125 lb 3.2 oz (56.79 kg)-147 lb 0.8 oz (66.7 kg)] 147 lb 0.8 oz (66.7 kg) (06/05 2057) Last BM Date:  (unable to remember)  Intake/Output from previous day:   Intake/Output this shift:    PE: Gen:  Alert, NAD, pleasant Card:  RRR, no M/G/R heard Pulm:  CTA, no W/R/R, good effort Abd: Soft, NT/ND, +BS, no HSM Ext:  No erythema, edema, or tenderness  Lab Results:   Recent Labs  02/25/16 1104 02/26/16 0400  WBC 10.8 11.4*  HGB 12.7 11.6*  HCT 37.6 35.3*  PLT 332 268   BMET  Recent Labs  02/25/16 1359 02/26/16 0400  NA 135 137  K 3.3* 4.2  CL 99* 105  CO2 26 27  GLUCOSE 102* 124*  BUN 6 6  CREATININE 0.62 0.53  CALCIUM 9.3 8.5*   PT/INR No results for input(s): LABPROT, INR in the last 72 hours. CMP     Component Value Date/Time   NA 137 02/26/2016 0400   K 4.2 02/26/2016 0400   CL 105 02/26/2016 0400   CO2 27 02/26/2016 0400   GLUCOSE 124* 02/26/2016 0400   BUN 6 02/26/2016 0400   CREATININE 0.53 02/26/2016 0400   CREATININE 0.62 02/25/2016 1026   CALCIUM 8.5* 02/26/2016 0400   PROT 7.8 02/25/2016 1359   ALBUMIN 4.9 02/25/2016 1359   AST 17 02/25/2016 1359   ALT 13* 02/25/2016 1359   ALKPHOS 62 02/25/2016 1359   BILITOT 0.8 02/25/2016 1359   GFRNONAA >60 02/26/2016 0400   GFRAA >60 02/26/2016 0400    Lipase  No results found for: LIPASE     Studies/Results: Ct Abdomen Pelvis W Contrast  02/25/2016  CLINICAL DATA:  Abdominal pain with nausea for 2 days, no bowel movement for 4 days, no flatus, abdominal distension but soft, question bowel obstruction EXAM: CT ABDOMEN AND PELVIS WITH CONTRAST TECHNIQUE: Multidetector CT imaging of the abdomen and pelvis was performed using the standard protocol following bolus administration of intravenous contrast. Sagittal and coronal MPR images reconstructed from axial data set. CONTRAST:  Dilute oral contrast.  100 cc Isovue 300 IV. COMPARISON:  None. FINDINGS: Lung bases clear. Nonspecific low-attenuation lesion posterior RIGHT lobe liver 10 x 9 mm diameter image 28. Liver, gallbladder, spleen, pancreas, kidneys, and adrenal glands normal. Marked distention of the colon particularly a large redundant dilated sigmoid loop extending into the LEFT upper quadrant bulb of the LEFT diaphragm measuring up to 15.2 cm transverse. Twisting of the mesentery identified at the sigmoid mesocolon, most prominently identified on coronal imaging, compatible with sigmoid volvulus. No colonic wall thickening or evidence of perforation. Small bowel decompressed. Stomach unremarkable. Normal appearing ureters, bladder, uterus, adnexae, and appendix. No mass, adenopathy, free fluid, free air, hernia, or acute bone lesion. IMPRESSION:  Marked colonic distention up to 15.2 cm transverse at a markedly distended sigmoid loop which extends into the LEFT upper quadrant under LEFT hemi diaphragm associated with swirling of the sigmoid mesocolon. 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 No evidence of perforation or small bowel dilatation. Findings called to Dr. Verdie MosherLiu On 02/25/2016 at 1651 hours. Electronically Signed   By: Ulyses SouthwardMark  Boles M.D.   On: 02/25/2016 16:51   Dg Abd 2 Views  02/25/2016  CLINICAL DATA:  Vomiting and constipation. EXAM: ABDOMEN - 2  VIEW COMPARISON:  None. FINDINGS: There is marked abnormal distension of the large bowel loops. Colonic fluid levels are identified. There is stool identified within the distal colon and in the rectum. No free intraperitoneal air identified. IMPRESSION: 1. Marked distension of the large bowel loops which may reflect distal bowel obstruction versus colonic ileus. In the setting of the distal bowel obstruction this may reflect stool impaction versus mechanical obstruction secondary to mass or stricture. Electronically Signed   By: Signa Kellaylor  Stroud M.D.   On: 02/25/2016 11:30   Dg Abd Acute W/chest  02/25/2016  CLINICAL DATA:  The patient is being sent for evaluation of bowel obstruction versus ileus. EXAM: DG ABDOMEN ACUTE W/ 1V CHEST COMPARISON:  None. FINDINGS: There is elevation left hemidiaphragm. The heart, hila, mediastinum, lungs, and pleura are normal. Dilated loops of large and small bowel are identified with air-fluid levels on the upright view. The top of the left hemidiaphragm was not included on the upright view. No free air seen under the right hemidiaphragm. No other evidence of free air identified. The bones and soft tissues are unremarkable. IMPRESSION: 1. Evaluation for free air is limited as the left hemidiaphragm was not completely included on the upright view. However, within this limitation, no evidence of free air is identified. 2. Dilated large and small bowel with air-fluid levels on the upright view. This could be seen with ileus or colonic obstruction. The findings are concerning for a colonic obstruction. Recommend CT imaging for further evaluation. 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 Dulce Sellarutlaw performed decompression of the volvulus, her pain is improved -Needs bowel prep (miralax started) and eventually sigmoid  colectomy -She is unsure if she wants to have the surgery here because her project with work is done and she is due to return back to [place].  She will talk with her husband and decide.   -Ambulate and IS -SCD's and lovenox or heparin okay  Chronic constipation Hypokalemia - improved, 4.2 Leukocytosis - 11.4    LOS: 1 day    Nonie HoyerMegan N Thuy Atilano 02/26/2016, 8:01 AM Pager: [phone]  (7am - 4:30pm M-F; 7am - 11:30am Sa/Su)

## 2016-02-26 NOTE — Discharge Instructions (Signed)
Volvulus Volvulus is an abnormal twisting of a portion of your digestive tract. Your digestive tract consists of your swallowing tube (esophagus), followed by your stomach, small intestine, and large intestine. This twisting can block the flow of digestion (intestinal obstruction). It can also block the blood flow to the part of the digestive tract that is twisted. Lack of blood flow can cause the twisted part of the digestive tract to die. Volvulus is a medical emergency. There are various types of volvulus:  Sigmoid volvulus is a twisting of the last part of your large intestine. This is the most common type.  Midgut volvulus usually occurs in children who are born with an abnormally positioned small intestine (malrotation).  Cecal volvulus may be caused by scar tissue from previous abdominal surgery.  Gastric volvulus is a rare type of volvulus that occurs when the stomach twists around itself. CAUSES  Volvulus may be caused by many different things. It can be something you are born with (congenital deformity), or it may be a problem that develops from another condition. RISK FACTORS You may have a greater risk of volvulus if you:  Are 33 years of age or older.  Have long-standing (chronic) constipation.  Have part of your stomach located above the area where the stomach and esophagus meet (paraesophageal hernia).  Are bedridden.  Have had previous abdominal surgery.  Live in a long-term care facility. SIGNS AND SYMPTOMS  Signs and symptoms of most types of volvulus include:  Abdominal pain.  Sigmoid volvulus may cause pain in the lower left part of the abdomen.  Cecal volvulus may cause pain in the lower right part of the abdomen.  Gastric and midgut volvulus may cause pain in the upper abdomen.  Bloating and swelling of the abdomen.  Decreased passing of gas or inability to pass gas.  Nausea.  Vomiting.  Constipation.  Tenderness when pressing on the abdomen. As  the condition gets worse, the volvulus can develop a hole (perforation) and leak digestive contents into the abdomen. This can cause late symptoms of volvulus, including:  Severe infection (sepsis).  Bleeding into the abdomen.  Very low blood pressure (shock). DIAGNOSIS  Your health care provider may suspect volvulus if you have sudden signs and symptoms of intestinal obstruction. A physical exam will be done. The health care provider will listen to your abdomen for the sounds of digestion and will feel your abdomen for tenderness. Imaging studies of your abdomen may also be done. These may include:  CT scan. This is the best imaging study for diagnosing volvulus.  Plain X-rays. These may show air and fluid levels and widening above the obstruction.  Ultrasound. TREATMENT  Volvulus is almost always a medical emergency requiring immediate surgery. A surgeon may attempt to do a procedure to untwist the volvulus if possible. If the volvulus cannot be untwisted, the part of the digestive tract involved may need to be removed.   This information is not intended to replace advice given to you by your health care provider. Make sure you discuss any questions you have with your health care provider.   Document Released: 06/03/2001 Document Revised: 09/29/2014 Document Reviewed: 05/24/2014 Elsevier Interactive Patient Education 2016 [employer].  Open Colectomy An open colectomy is surgery to take out part or all of the large intestine (colon). This procedure is used to treat several conditions, including:  Inflammation and infection of the colon (diverticulitis).  Tumors or masses in the colon.  Inflammatory bowel disease, such as Crohn  disease or ulcerative colitis. Colectomy is an option when symptoms cannot be controlled with medicines.  Bleeding from the colon that cannot be controlled by another method.  Blockage or obstruction of the colon. LET [facility] PROVIDER KNOW  ABOUT:  Any allergies you have.  All medicines you are taking, including vitamins, herbs, eye drops, creams, and over-the-counter medicines.  Previous problems you or members of your family have had with the use of anesthetics.  Any blood disorders you have.  Previous surgeries you have had.  Medical conditions you have. RISKS AND COMPLICATIONS  Generally, this is a safe procedure. However, as with any procedure, complications can occur. Possible complications include:  An infection developing in the area where the surgery was done.  Problems with the incisions, including:  Bleeding from an incision.  The wound reopening.  Tissues from inside the abdomen bulging through the incision (hernia).  Bleeding inside the abdomen.  Reopening of the colon where it was stitched or stapled together. This is a serious complication. Another procedure may be needed to fix the problem.  Damage to other organs in the abdomen.  A blood clot forming in a vein and traveling to the lungs.  Future blockage of the small intestine from scar tissue. Another surgery may be needed to repair this. BEFORE THE PROCEDURE  Various tests may be done before the procedure. These may include:  Blood tests.  A test to check the heart's rhythm (electrocardiography).  A CT scan to get pictures of your abdomen.  Ask your health care provider about changing or stopping any regular medicines. You will need to stop taking aspirin and nonsteroidal anti-inflammatory drugs (NSAIDs) at least 5 days before the surgery. You will also need to stop taking any blood thinners and vitamin E.  You may be prescribed an oral bowel prep. This involves drinking a large amount of medicated liquid, starting the day before your surgery. The liquid will cause you to have multiple loose stools until your stool is almost clear or light green. This cleans out your colon in preparation for the surgery.  Do not eat or drink anything  else once you have started the bowel prep, unless your health care provider tells you it is safe to do so.  You may also be given antibiotic pills to clean out your colon of bacteria. Be sure to follow the directions carefully and take the medicine at the correct time.  Make plans to have someone drive you home after your hospital stay. Also arrange for someone to help you with activities during your recovery. PROCEDURE This surgery can take 2-4 hours.  Small monitors will be put on your body. They are used to check your heart, blood pressure, and oxygen level.  An IV access tube will be put into one of your veins. Medicine will be able to flow directly into your body through this IV tube.  You might be given a medicine to help you relax (sedative).  You will be given a medicine to make you sleep through the procedure (general anesthetic). A breathing tube may be placed into your lungs during the procedure.  A thin, flexible tube (catheter) will be placed into your bladder to collect urine.  A tube may be put in through your nose. It is called a nasogastric tube. It is used to remove stomach fluids after surgery until the intestines start working again.  Once you are asleep, the surgeon will make an incision in the abdomen.  Clamps  or staples are put on both ends of the diseased part of the colon.  The part of the intestine between the clamps or staples is removed.  If possible, the ends of the healthy colon that remain will be stitched or stapled together to allow your body to expel waste (stool).  Sometimes, the remaining colon cannot be stitched back together. If this is the case, a colostomy is needed. For a colostomy:  An opening (stoma) to the outside of your body is made through the abdomen.  The end of the colon is brought to the opening. It is stitched to the skin.  A bag is attached to the opening. Stool will drain into this bag. The bag is removable.  The colostomy can  be temporary or permanent.  The incision from the colectomy will be closed with stitches or staples. AFTER THE PROCEDURE  You will stay in a recovery area until the anesthetic has worn off. Your blood pressure and pulse will be checked often. Then you will be taken to a hospital room.  You will continue to get fluids through the IV tube for a while. The IV tube will be taken out when the colon starts working again.  You will start on clear liquids and gradually go back to a normal diet.  You will be encouraged to cough and to take deep breaths to open your lungs and prevent pneumonia.  Some pain is normal after a colectomy. You will be given pain medicine as needed.  You will be urged to get up and start walking within a day.  If you had a colostomy, your health care provider will explain how it works and what you will need to do.  You will likely need to stay in the hospital for 3-7 days.   This information is not intended to replace advice given to you by your health care provider. Make sure you discuss any questions you have with your health care provider.   Document Released: 07/06/2009 Document Revised: 06/29/2013 Document Reviewed: 04/20/2013 Elsevier Interactive Patient Education [employer]

## 2016-02-26 NOTE — Progress Notes (Signed)
Pt arrived to floor alert and oriented by wheelchair from endoscopy. Pt states no pain or nausea. Education given to patient and family on unit routines and care of plan. Fluids initiated in left AC. At 2245 paged MD to clarify orders. NG ordered discontinued per verbal order unless patient stated vomiting. Ordered to give Potassium runners at this time. Pt vitals stable and will continue to monitor.

## 2016-02-26 NOTE — Discharge Summary (Signed)
[facility] Discharge Summary   Patient ID: Sylvia Barron MRN: [id number] DOB/AGE: 24-Jun-1981 35 y.o.  Admit date: 02/25/2016 Discharge date: 02/26/2016  Admitting Diagnosis: Sigmoid volvulus Chronic constipation Leukocytosis  Discharge Diagnosis Patient Active Problem List   Diagnosis Date Noted  . Sigmoid volvulus (HCC) 02/25/2016    Consultants Dr. Dulce Sellar - Gastroenterology  Imaging: Ct Abdomen Pelvis W Contrast  02/25/2016  CLINICAL DATA:  Abdominal pain with nausea for 2 days, no bowel movement for 4 days, no flatus, abdominal distension but soft, question bowel obstruction EXAM: CT ABDOMEN AND PELVIS WITH CONTRAST TECHNIQUE: Multidetector CT imaging of the abdomen and pelvis was performed using the standard protocol following bolus administration of intravenous contrast. Sagittal and coronal MPR images reconstructed from axial data set. CONTRAST:  Dilute oral contrast.  100 cc Isovue 300 IV. COMPARISON:  None. FINDINGS: Lung bases clear. Nonspecific low-attenuation lesion posterior RIGHT lobe liver 10 x 9 mm diameter image 28. Liver, gallbladder, spleen, pancreas, kidneys, and adrenal glands normal. Marked distention of the colon particularly a large redundant dilated sigmoid loop extending into the LEFT upper quadrant bulb of the LEFT diaphragm measuring up to 15.2 cm transverse. Twisting of the mesentery identified at the sigmoid mesocolon, most prominently identified on coronal imaging, compatible with sigmoid volvulus. No colonic wall thickening or evidence of perforation. Small bowel decompressed. Stomach unremarkable. Normal appearing ureters, bladder, uterus, adnexae, and appendix. No mass, adenopathy, free fluid, free air, hernia, or acute bone lesion. IMPRESSION: Marked colonic distention up to 15.2 cm transverse at a markedly distended sigmoid loop which extends into the LEFT upper quadrant under LEFT hemi diaphragm associated with swirling of the sigmoid mesocolon.  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 No evidence of perforation or small bowel dilatation. Findings called to Dr. Verdie Mosher On 02/25/2016 at 1651 hours. Electronically Signed   By: Ulyses Southward M.D.   On: 02/25/2016 16:51   Dg Abd 2 Views  02/25/2016  CLINICAL DATA:  Vomiting and constipation. EXAM: ABDOMEN - 2 VIEW COMPARISON:  None. FINDINGS: There is marked abnormal distension of the large bowel loops. Colonic fluid levels are identified. There is stool identified within the distal colon and in the rectum. No free intraperitoneal air identified. IMPRESSION: 1. Marked distension of the large bowel loops which may reflect distal bowel obstruction versus colonic ileus. In the setting of the distal bowel obstruction this may reflect stool impaction versus mechanical obstruction secondary to mass or stricture. Electronically Signed   By: Signa Kell M.D.   On: 02/25/2016 11:30   Dg Abd Acute W/chest  02/25/2016  CLINICAL DATA:  The patient is being sent for evaluation of bowel obstruction versus ileus. EXAM: DG ABDOMEN ACUTE W/ 1V CHEST COMPARISON:  None. FINDINGS: There is elevation left hemidiaphragm. The heart, hila, mediastinum, lungs, and pleura are normal. Dilated loops of large and small bowel are identified with air-fluid levels on the upright view. The top of the left hemidiaphragm was not included on the upright view. No free air seen under the right hemidiaphragm. No other evidence of free air identified. The bones and soft tissues are unremarkable. IMPRESSION: 1. Evaluation for free air is limited as the left hemidiaphragm was not completely included on the upright view. However, within this limitation, no evidence of free air is identified. 2. Dilated large and small bowel with air-fluid levels on the upright view. This could be seen with ileus or colonic obstruction. The findings are concerning for a colonic obstruction. Recommend  CT imaging for further  evaluation. Electronically Signed   By: Gerome Sam III M.D   On: 02/25/2016 13:40   Dg Abd Portable 1v  02/26/2016  CLINICAL DATA:  Pt states no abd pain.  Sigmoid volvulus EXAM: PORTABLE ABDOMEN - 1 VIEW COMPARISON:  CT 02/25/2016 FINDINGS: Significant interval decompression of the previously dilated sigmoid colon. Small bowel is decompressed. Oral contrast material in the nondilated proximal colon. Mild residual gaseous distention of the sigmoid. Rectum is decompressed. Regional bones unremarkable. IMPRESSION: 1. Interval decompression of previously noted sigmoid gaseous dilatation. Electronically Signed   By: Corlis Leak M.D.   On: 02/26/2016 08:57    Procedures Dr. Dulce Sellar (02/25/16) - Decompressive sigmoidoscopy  Hospital Course:  35 y/o [place] female 4 day history of progressive abdominal bloating, pain and inability to have a BM. She does have a history of chronic constipation and was straining to have a BM but was unable to pass any stool. She has been getting progressing more distended since that time and has had some non-bloody n/v. She states she is having pain in the left lower back. No fever or chills. Her husband is with her.  She has a twin sister who lives in [place], had similar issue with constipation who also recently had a sigmoid volvulus who needed surgery.  Workup showed sigmoid volvulus with no signs of peritonitis.  Patient was admitted and underwent procedure listed above.  Dr. Dulce Sellar as able to decompress the sigmoid colon.  She has had several BM's and her pain is nearly gone.  Clear liquid diet resumed.  She tolerated this well and was advance to full liquid diet with protein supplements.  She was given miralax.  She is instructed to take miralax and colace daily so she has a BM every day.  We offered her surgery, but she is hesitant to get the surgery here because she is due to return to [place] on June 20th after finishing a work project here in [place].  She  wishes to wait several weeks before making a decision on surgery.  She has requested discharge since she has been having BM's and pain is gone.  She understands that if the pain were to return she needs to come back to the ER for evaluation.  She understands that repeated volvulus may require emergent surgery.  On HD #2 the patient was voiding well, tolerating full liquid diet, ambulating well, pain well controlled, vital signs stable, and felt stable for discharge home.  Patient will follow up in our office as desired or can follow up with a surgeon in [place] for consideration of colectomy.  She knows to call with questions or concerns.       Medication List    TAKE these medications        docusate sodium 100 MG capsule  Commonly known as:  COLACE  Take 1 capsule (100 mg total) by mouth 2 (two) times daily.     polyethylene glycol packet  Commonly known as:  MIRALAX / GLYCOLAX  Take 17 g by mouth daily.         Follow-up Information    Follow up with You may obtain an in network pcp with insurance coverage via the customer service number or web site - [email or web address].   Contact information:   Please also use the list of in network providers provided to you by the ED case manager       Schedule an appointment as soon as possible  for a visit with Adolph PollackOSENBOWER,TODD J, MD.   Specialty:  General Surgery   Why:  For post-hospital follow up, As needed   Contact information:   [address] [place] [address] [phone]       Please follow up.   Why:  We urge you to make arrangements to see a surgeon in [place] as soon as you arrive home.  You may need to be considered for a sigmoid colectomy since you sigmoid volvulus could return.      Signed: Nonie HoyerMegan N. Demi Trieu, [facility] [phone]  02/26/2016, 3:03 PM

## 2016-02-27 LAB — URINE CULTURE

## 2018-01-25 IMAGING — CR DG ABDOMEN ACUTE W/ 1V CHEST
2 series · 2 of 2 positions shown · non-contrast
Comparison: None.

CLINICAL DATA: The patient is being sent for evaluation of bowel
obstruction versus ileus.

EXAM:
DG ABDOMEN ACUTE W/ 1V CHEST

[w chest pa]
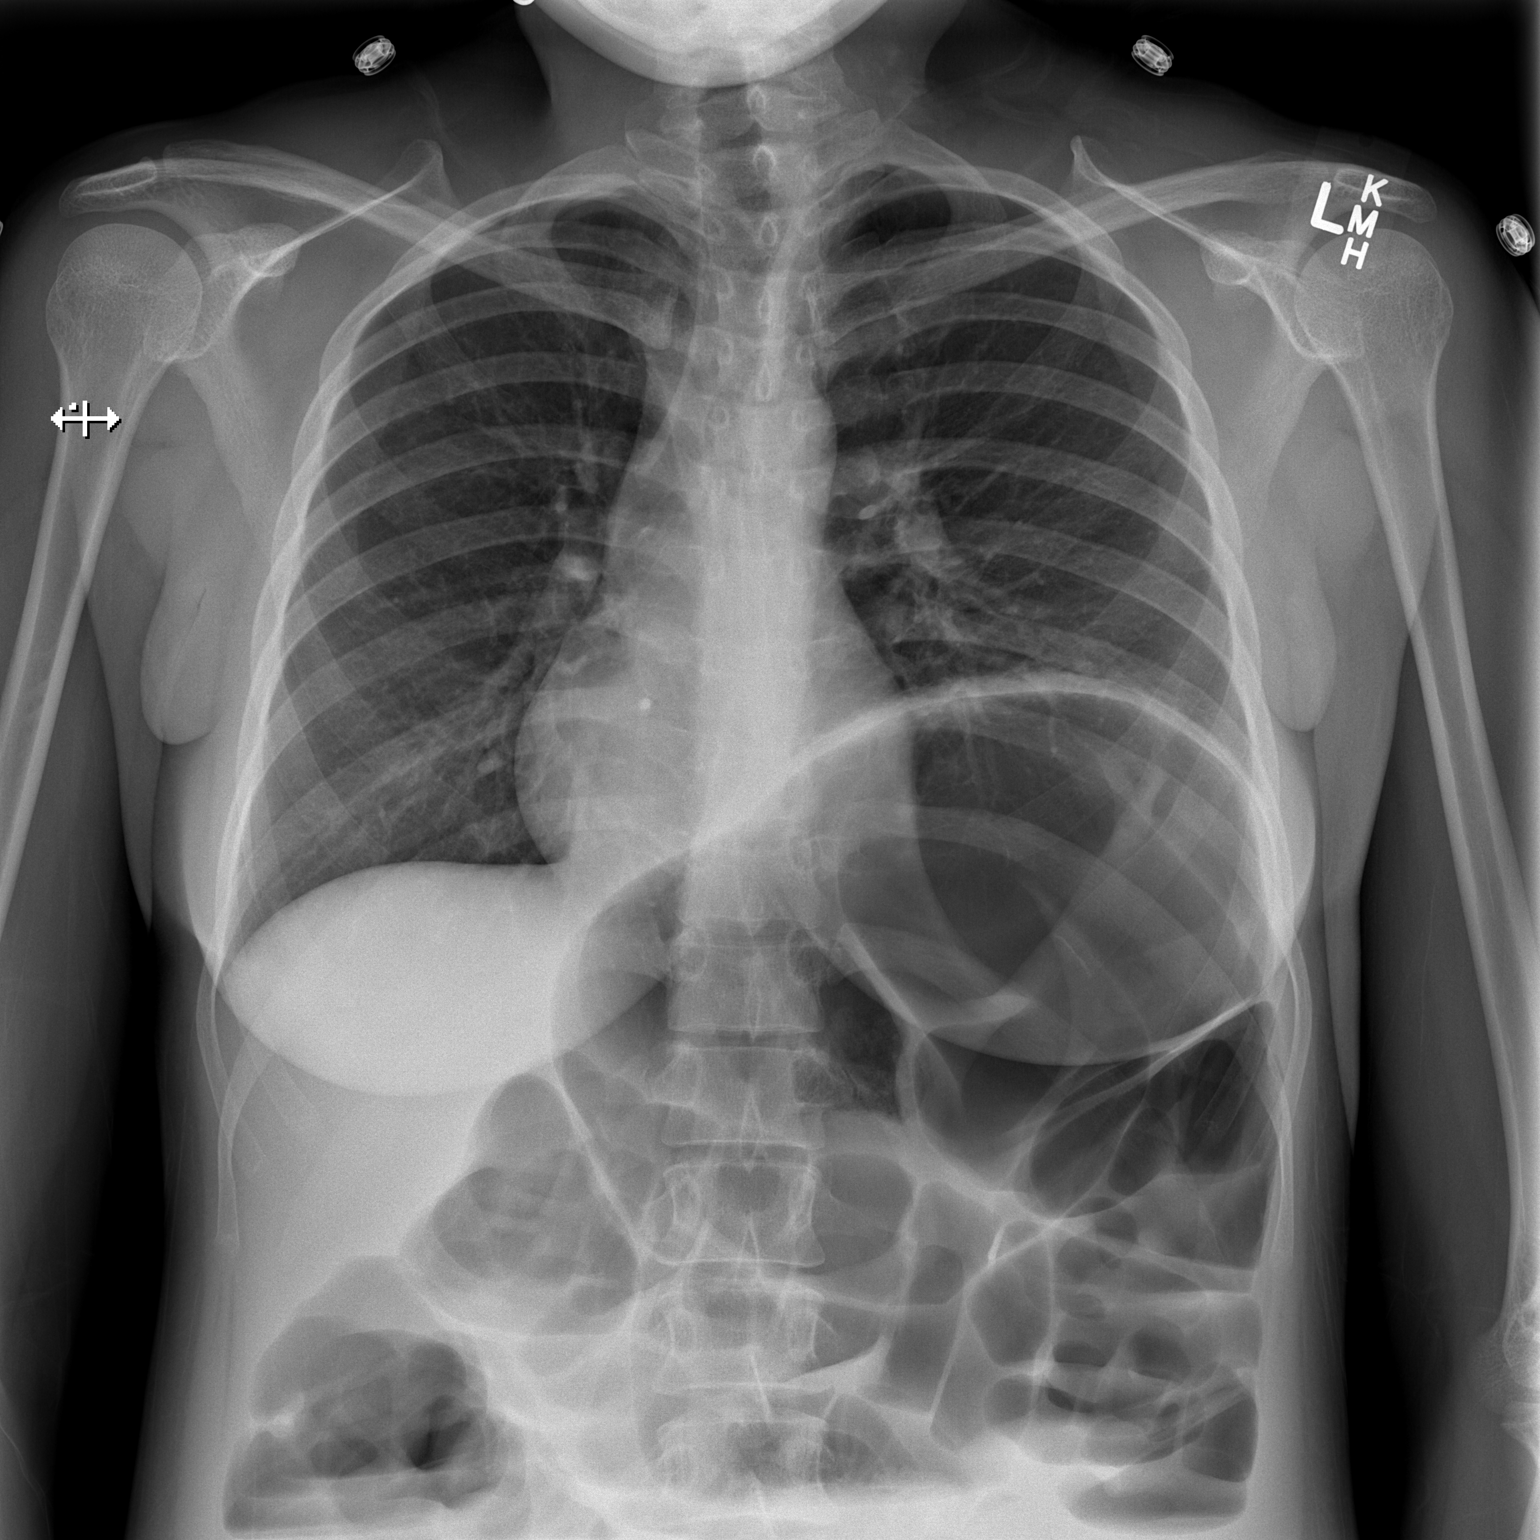

[w abdomen upright]
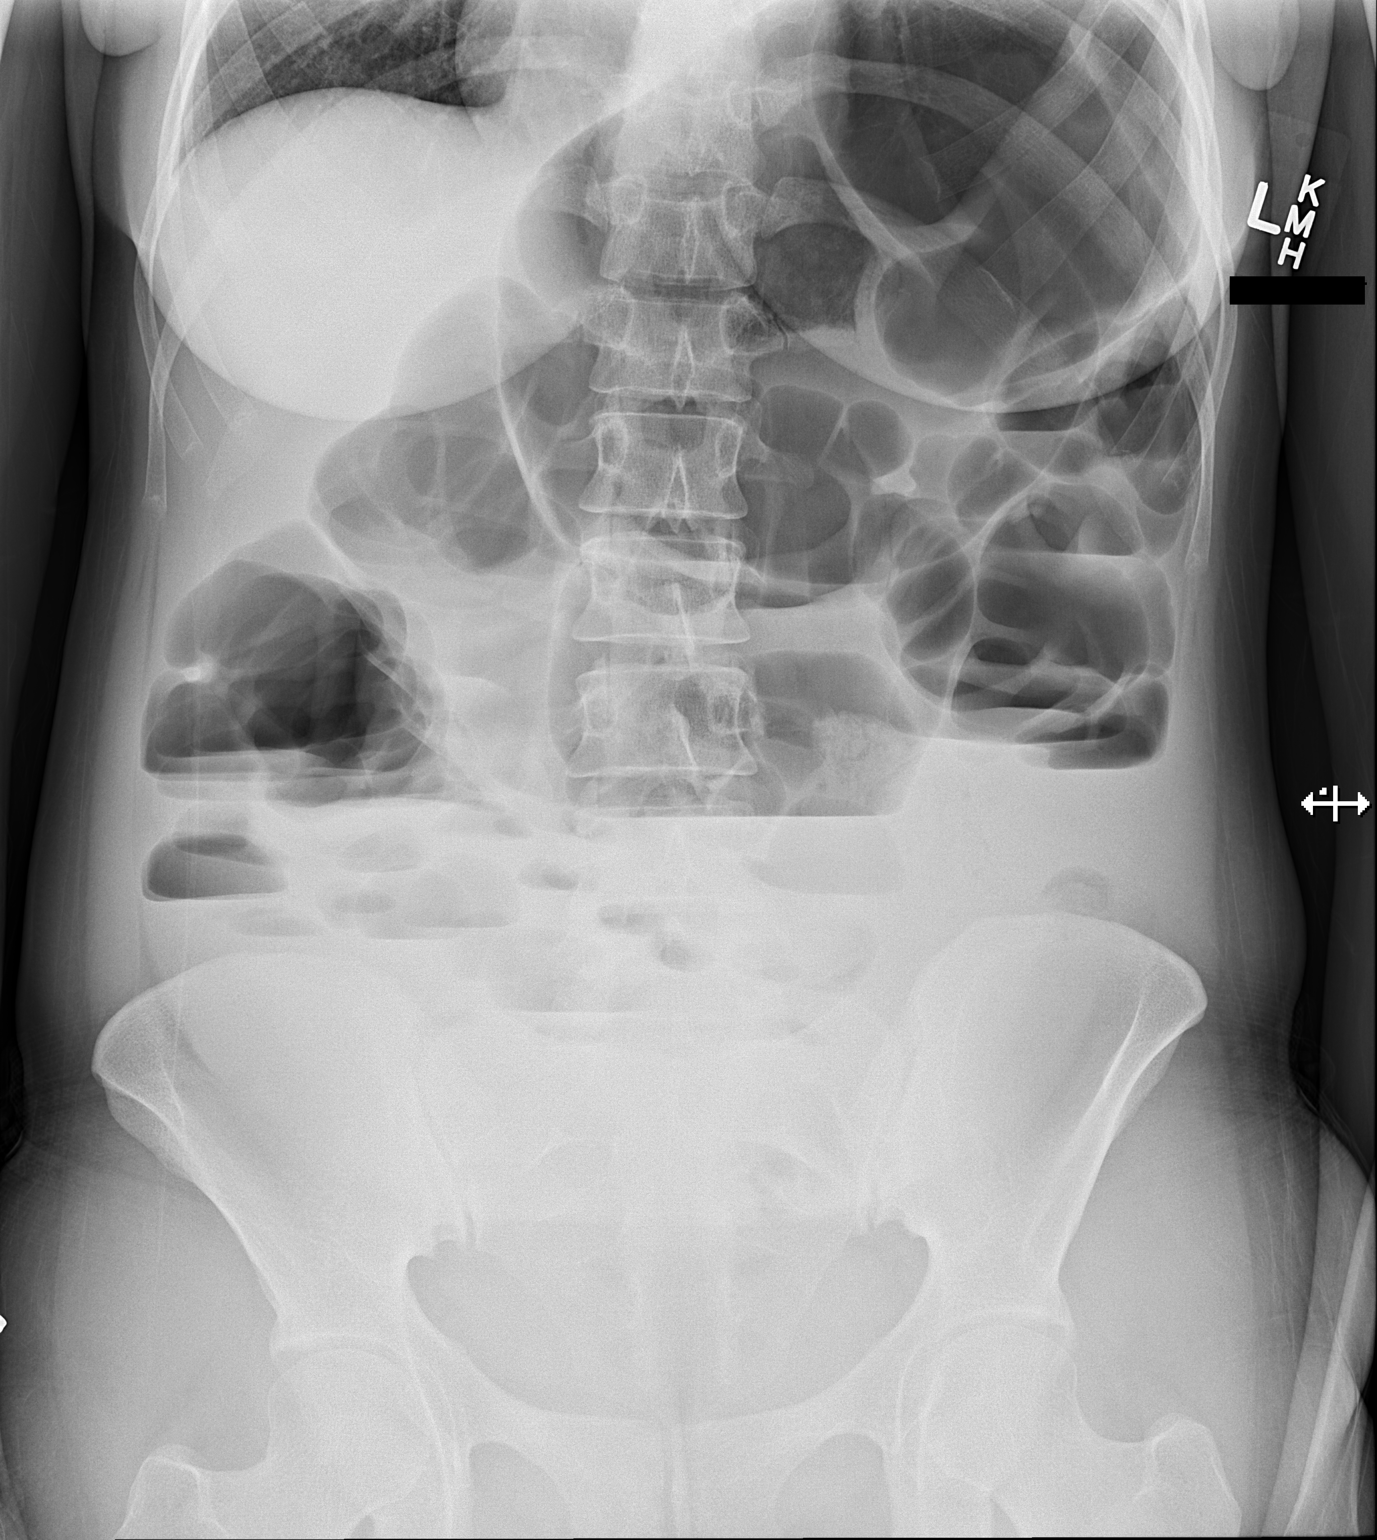

[2 of 2 positions shown; findings below may reference images not displayed]

FINDINGS: There is elevation left hemidiaphragm. The heart, hila, mediastinum,
lungs, and pleura are normal.

Dilated loops of large and small bowel are identified with air-fluid
levels on the upright view. The top of the left hemidiaphragm was
not included on the upright view. No free air seen under the right
hemidiaphragm. No other evidence of free air identified. The bones
and soft tissues are unremarkable.
IMPRESSION: 1. Evaluation for free air is limited as the left hemidiaphragm was
not completely included on the upright view. However, within this
limitation, no evidence of free air is identified.
2. Dilated large and small bowel with air-fluid levels on the
upright view. This could be seen with ileus or colonic obstruction.
The findings are concerning for a colonic obstruction. Recommend CT
imaging for further evaluation.

## 2018-01-25 IMAGING — CT CT ABD-PELV W/ CM
2 of 4 series · 16 of 46 positions shown, 18 images · IV contrast (ISOVUE)
Comparison: None.

CLINICAL DATA: Abdominal pain with nausea for 2 days, no bowel
movement for 4 days, no flatus, abdominal distension but soft,
question bowel obstruction

EXAM:
CT ABDOMEN AND PELVIS WITH CONTRAST
TECHNIQUE: Multidetector CT imaging of the abdomen and pelvis was performed
using the standard protocol following bolus administration of
intravenous contrast. Sagittal and coronal MPR images reconstructed
from axial data set.
CONTRAST:  Dilute oral contrast.  100 cc Isovue 300 IV.

[Series 2: abd/pel with · axial · 0.78mm/px · z∈[-334,+61]mm · 13 of 89 slices shown, 15 images]
[im 5/89  soft-tissue]
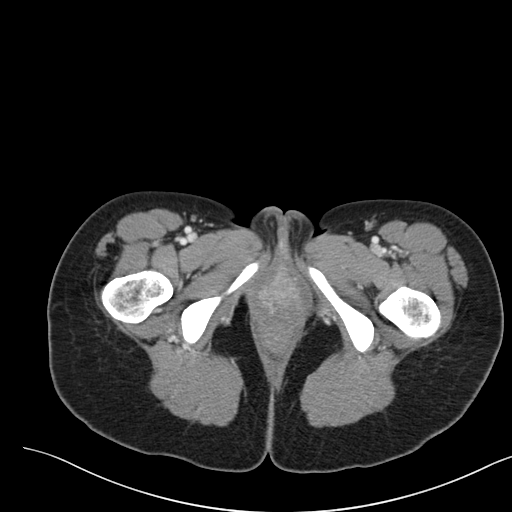
[im 5/89  bone]
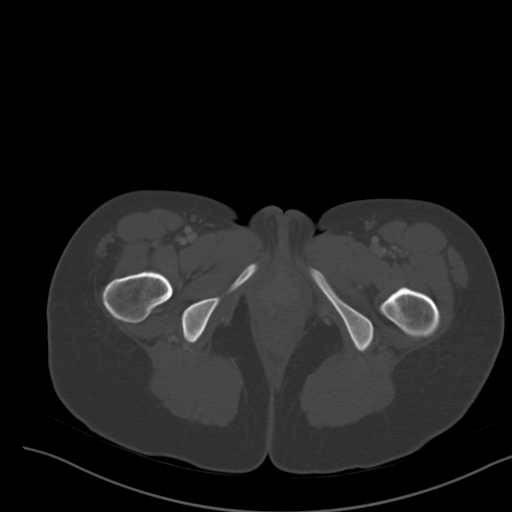
[im 14/89  soft-tissue]
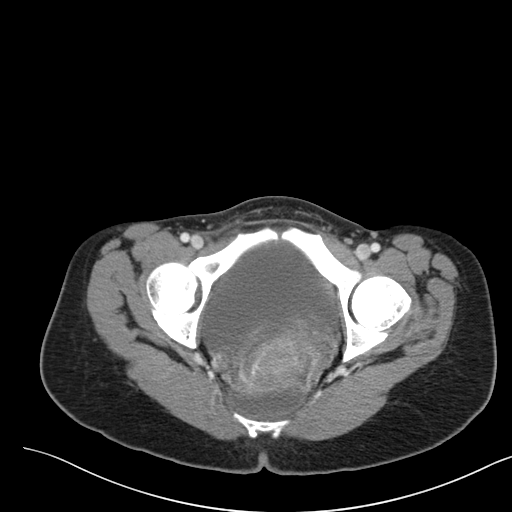
[im 19/89  soft-tissue]
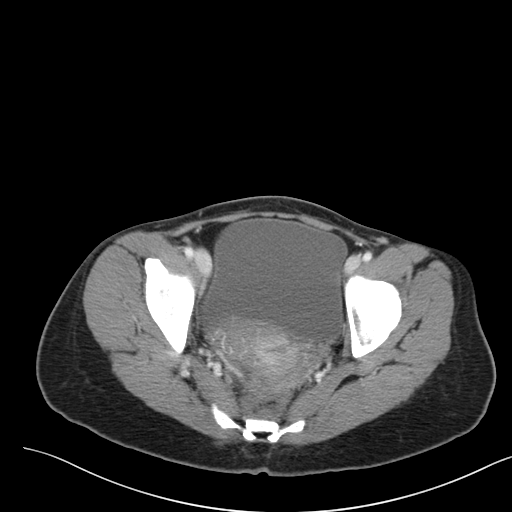
[im 24/89  soft-tissue]
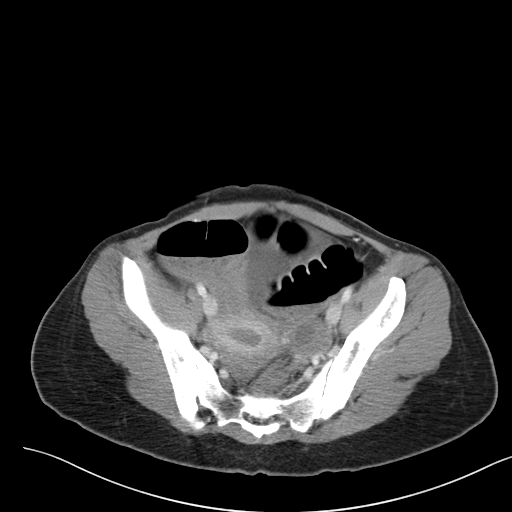
[im 33/89  soft-tissue]
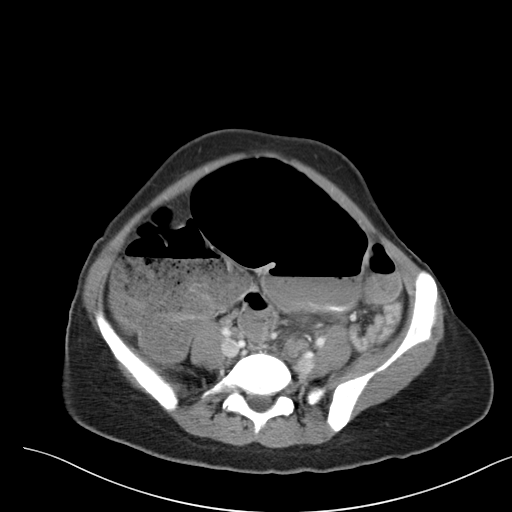
[im 38/89  soft-tissue]
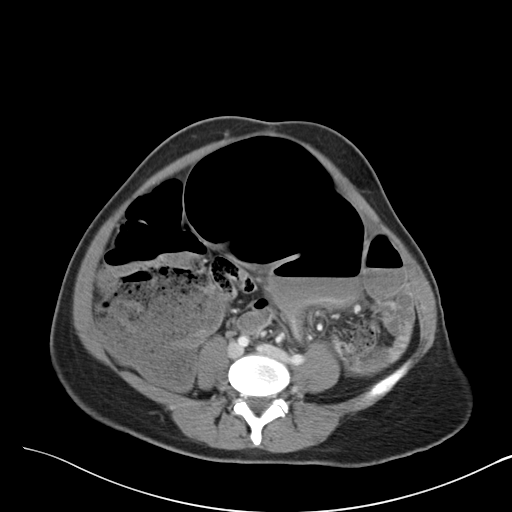
[im 47/89  soft-tissue]
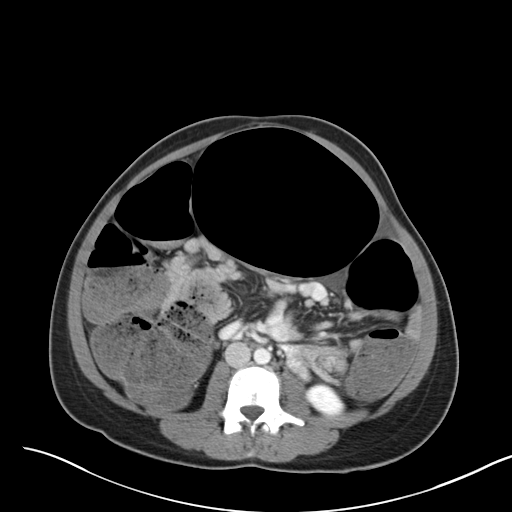
[im 51/89  soft-tissue]
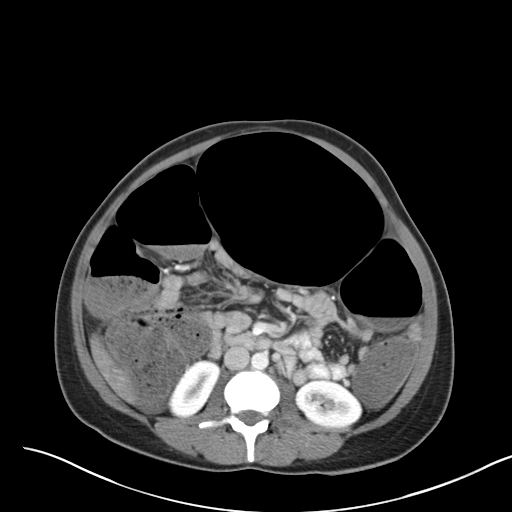
[im 56/89  soft-tissue]
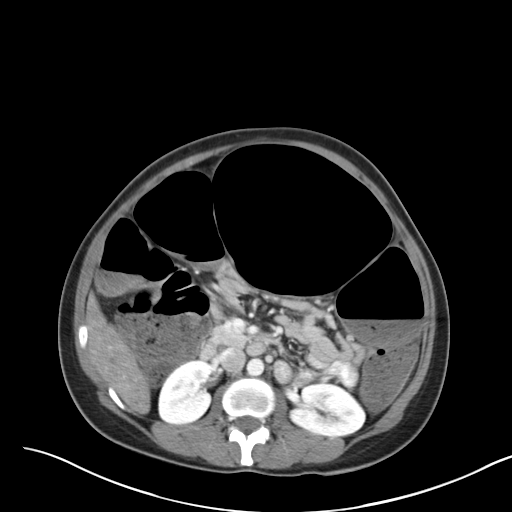
[im 56/89  bone]
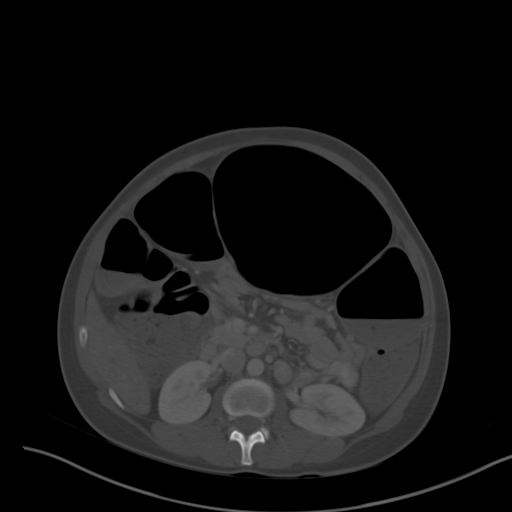
[im 65/89  soft-tissue]
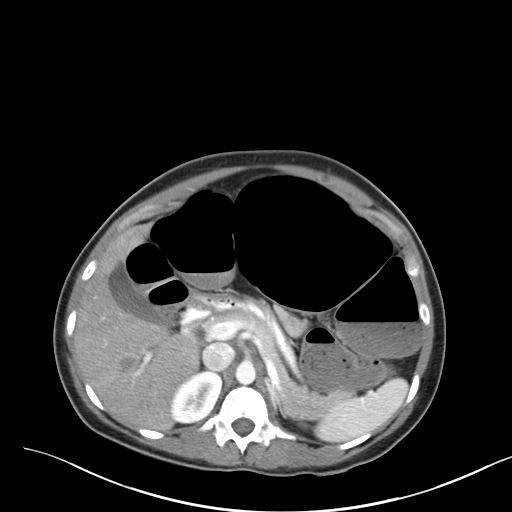
[im 70/89  soft-tissue]
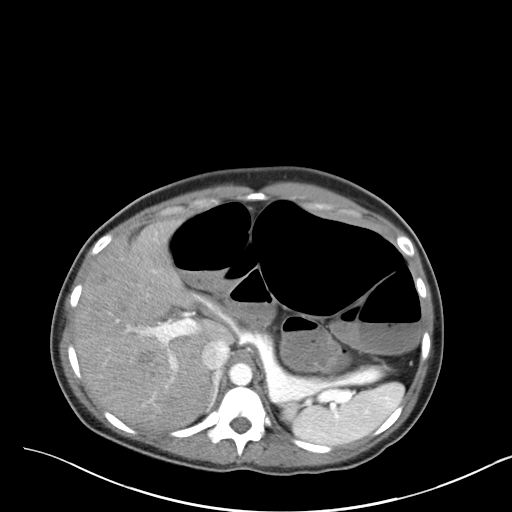
[im 75/89  soft-tissue]
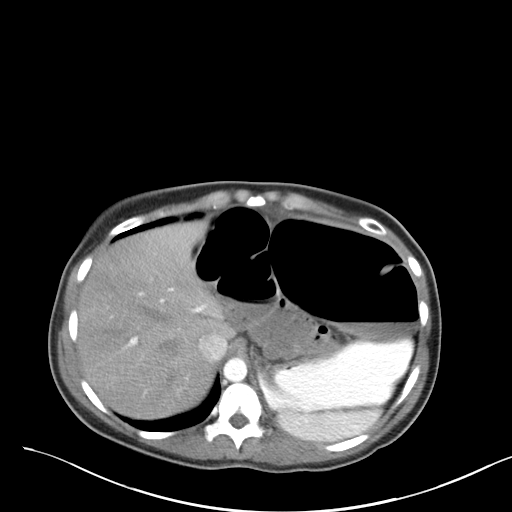
[im 84/89  soft-tissue]
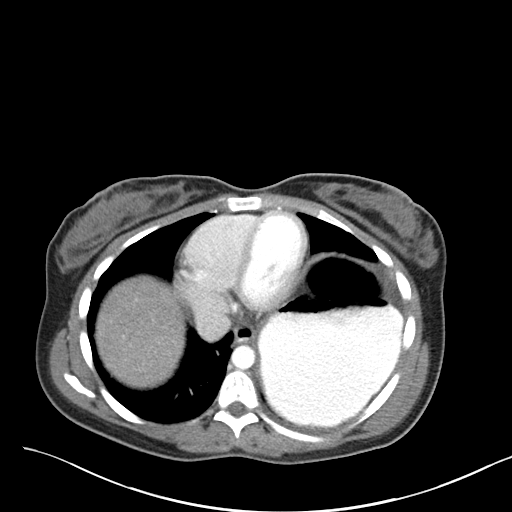

[Series 3: coronal a/|p · coronal · 0.74mm/px · 3 of 146 slices shown]
[im 49/146  soft-tissue]
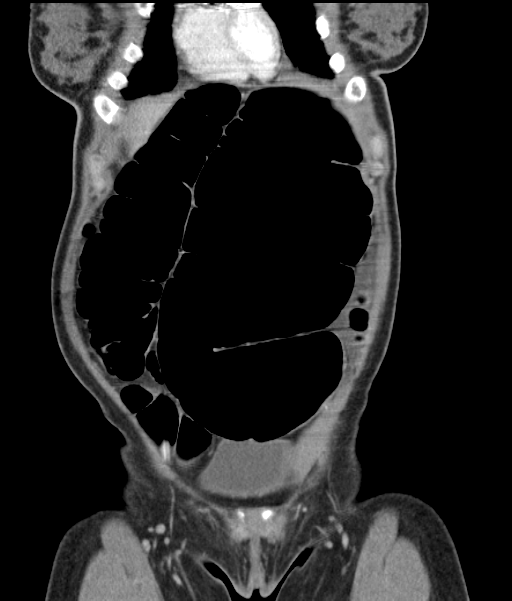
[im 65/146  soft-tissue]
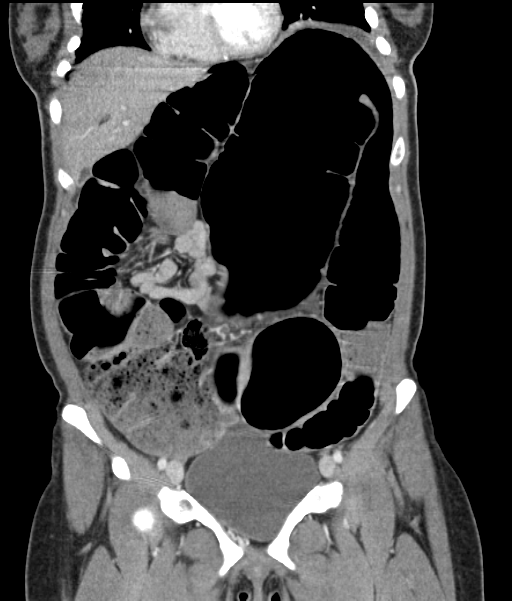
[im 81/146  soft-tissue]
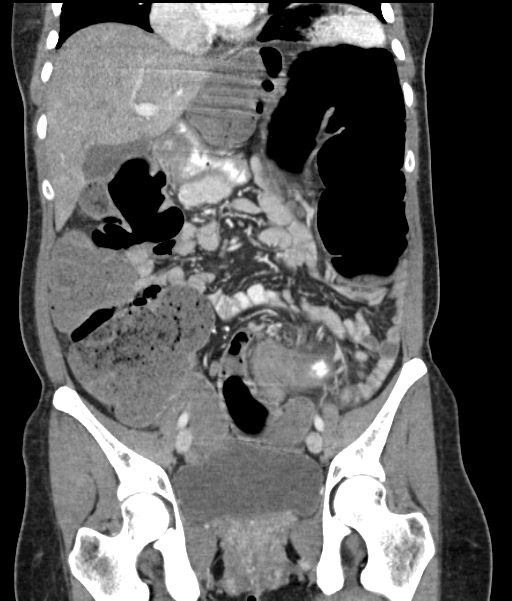

[16 of 46 positions shown; findings below may reference images not displayed]

FINDINGS: Lung bases clear.

Nonspecific low-attenuation lesion posterior RIGHT lobe liver 10 x 9
mm diameter image 28.

Liver, gallbladder, spleen, pancreas, kidneys, and adrenal glands
normal.

Marked distention of the colon particularly a large redundant
dilated sigmoid loop extending into the LEFT upper quadrant bulb of
the LEFT diaphragm measuring up to 15.2 cm transverse.

Twisting of the mesentery identified at the sigmoid mesocolon, most
prominently identified on coronal imaging, compatible with sigmoid
volvulus.

No colonic wall thickening or evidence of perforation.

Small bowel decompressed.

Stomach unremarkable.

Normal appearing ureters, bladder, uterus, adnexae, and appendix.

No mass, adenopathy, free fluid, free air, hernia, or acute bone
lesion.
IMPRESSION: Marked colonic distention up to 15.2 cm transverse at a markedly
distended sigmoid loop which extends into the LEFT upper quadrant
under LEFT hemi diaphragm associated with swirling of the sigmoid
mesocolon. Findings are consistent with sigmoid volvulus with
secondary colonic obstruction.

No evidence of perforation or small bowel dilatation.

Findings called to Dr. Marijann On 02/25/2016 at 4314 hours.
